# Patient Record
Sex: Male | Born: 1975
Health system: Southern US, Community
[De-identification: ages and names within clinical notes are randomized; demographics above are authoritative.]

## PROBLEM LIST (undated history)

## (undated) DIAGNOSIS — I10 Essential (primary) hypertension: Secondary | ICD-10-CM

---

## 2009-12-22 ENCOUNTER — Ambulatory Visit: Payer: Self-pay | Admitting: Infectious Diseases

## 2009-12-22 DIAGNOSIS — B54 Unspecified malaria: Secondary | ICD-10-CM | POA: Insufficient documentation

## 2009-12-22 LAB — CONVERTED CEMR LAB
ALT: 87 units/L — ABNORMAL HIGH (ref 0–53)
AST: 43 units/L — ABNORMAL HIGH (ref 0–37)
Alkaline Phosphatase: 40 units/L (ref 39–117)
BUN: 11 mg/dL (ref 6–23)
Basophils Absolute: 0.1 10*3/uL (ref 0.0–0.1)
Basophils Relative: 1 % (ref 0–1)
Chloride: 100 meq/L (ref 96–112)
Creatinine, Ser: 0.8 mg/dL (ref 0.40–1.50)
Eosinophils Absolute: 0.2 10*3/uL (ref 0.0–0.7)
Eosinophils Relative: 3 % (ref 0–5)
HCT: 41.1 % (ref 39.0–52.0)
Hemoglobin: 14 g/dL (ref 13.0–17.0)
Lymphocytes Relative: 41 % (ref 12–46)
MCHC: 34.1 g/dL (ref 30.0–36.0)
MCV: 87.1 fL (ref 78.0–100.0)
Monocytes Absolute: 0.7 10*3/uL (ref 0.1–1.0)
Platelets: 235 10*3/uL (ref 150–400)
RDW: 14.9 % (ref 11.5–15.5)

## 2009-12-23 ENCOUNTER — Encounter: Payer: Self-pay | Admitting: Infectious Diseases

## 2009-12-23 LAB — CONVERTED CEMR LAB
Basophils Absolute: 0 10*3/uL (ref 0.0–0.1)
Basophils Relative: 1 % (ref 0–1)
Eosinophils Absolute: 0.2 10*3/uL (ref 0.0–0.7)
MCHC: 33.8 g/dL (ref 30.0–36.0)
MCV: 87.3 fL (ref 78.0–100.0)
Monocytes Relative: 9 % (ref 3–12)
Neutrophils Relative %: 47 % (ref 43–77)
RBC: 4.64 M/uL (ref 4.22–5.81)
RDW: 15 % (ref 11.5–15.5)

## 2010-05-29 ENCOUNTER — Emergency Department (HOSPITAL_COMMUNITY)
Admission: EM | Admit: 2010-05-29 | Discharge: 2010-05-29 | Payer: Self-pay | Source: Home / Self Care | Admitting: Emergency Medicine

## 2010-07-31 NOTE — Assessment & Plan Note (Signed)
Summary: Dr Maleny Candy/[facility] pt[facility]   CC:  [facility] referral  r/o malaria .  History of Present Illness: 35 yo M with hx of trip to [place] for 25 days, returned 12-16-09. He states that he multiple episode of malaria while living in [place]. He had mulitple mosquito bites there. He took fansidar (2 doses while there).  Is having headaches, dizzyness, chills (mild), some GI uspet, no diarrhea, no fever ( but feels warm). He states that this is similar to when he had his previous malarial episodes. Has prev taken pyrmethrine for malaria episodes.  Has NL CBC 6-24-11at urgent care.   Preventive Screening-Counseling & Management  Alcohol-Tobacco     Alcohol drinks/day: <1     Alcohol type: beer     Smoking Status: never  Caffeine-Diet-Exercise     Caffeine use/day: 2  Safety-Violence-Falls     Seat Belt Use: yes   Current Allergies: ! * CHLOROQUINE Past History:  Past Medical History: Current Problems:  MALARIA (ICD-084.6)  Family History: denies  Social History: Never Smoked Alcohol use-yes, occasional.  Married  Review of Systems       c/o back pain, no rashes. see HPI. last HIV test 3 years ago, only sex partner is wife.   Vital Signs:  Patient profile:   35 year old male Height:      63 inches Weight:      183 pounds BMI:     32.53 BSA:     1.86 Temp:     98.4 degrees F Pulse rate:   70 / minute BP sitting:   150 / 84  Vitals Entered By: Tomasita Morrow RN (December 22, 2009 12:59 PM) CC: [facility] referral  r/o malaria  Is Patient Diabetic? No Pain Assessment Patient in pain? no      Nutritional Status BMI of > 30 = obese Nutritional Status Detail normal   Have you ever been in a relationship where you felt threatened, hurt or afraid?No  Domestic Violence Intervention none  Does patient need assistance? Functional Status Self care   Physical Exam  General:  well-developed, well-nourished, and  well-hydrated.   Eyes:  pupils equal, pupils round, and pupils reactive to light.   Mouth:  pharynx pink and moist and no exudates.   Neck:  no masses.   Lungs:  normal respiratory effort and normal breath sounds.   Heart:  normal rate, regular rhythm, and no murmur.   Abdomen:  soft, non-tender, and normal bowel sounds.   Cervical Nodes:  no anterior cervical adenopathy and no posterior cervical adenopathy.     Impression & Recommendations:  Problem # 1:  MALARIA (ICD-084.6)  His story si certainly convincing for Malaria. He probably has some degree of immunity however this does wane over time after leaving residence in an endemic area. Will send a blood smear for malaria as well as CBC, CMP. He is at risk for other illness as well (dengue, yellow fever). He does not have icterus which make these seem less likely. I offered to check a HIV test on him but he states that he is married and his wife is his only sex partner. will have him f/u if he does not feel better at end of therapy.   Orders: Consultation Level IV (16109) T-CMP with estimated GFR ([address]) T-CBC w/Diff ([id number]) T- * Misc. Laboratory test [phone])  Medications Added to Medication List This Visit: 1)  Malarone 250-100 Mg Tabs (Atovaquone-proguanil  hcl) .... 4 tab by mouth once daily for 3 days Prescriptions: MALARONE 250-100 MG TABS (ATOVAQUONE-PROGUANIL HCL) 4 tab by mouth once daily for 3 days  #12 x 0   Entered and Authorized by:   Johny Sax MD   Signed by:   Johny Sax MD on 12/22/2009   Method used:   Print then Give to Patient   RxID:   [id number]

## 2010-09-11 LAB — URINALYSIS, ROUTINE W REFLEX MICROSCOPIC
Bilirubin Urine: NEGATIVE
Glucose, UA: NEGATIVE mg/dL
Ketones, ur: NEGATIVE mg/dL
Nitrite: NEGATIVE
Protein, ur: NEGATIVE mg/dL
pH: 6 (ref 5.0–8.0)

## 2010-09-11 LAB — COMPREHENSIVE METABOLIC PANEL
CO2: 27 mEq/L (ref 19–32)
Calcium: 9 mg/dL (ref 8.4–10.5)
Creatinine, Ser: 0.69 mg/dL (ref 0.4–1.5)
GFR calc non Af Amer: >60 mL/min (ref >60)
Glucose, Bld: 111 mg/dL — ABNORMAL HIGH (ref 70–99)

## 2010-09-11 LAB — CBC
HCT: 45.2 % (ref 39.0–52.0)
Hemoglobin: 15.9 g/dL (ref 13.0–17.0)
MCH: 30 pg (ref 26.0–34.0)
MCHC: 35.2 g/dL (ref 30.0–36.0)

## 2010-09-11 LAB — LIPASE, BLOOD: Lipase: 35 U/L (ref 11–59)

## 2011-03-17 ENCOUNTER — Emergency Department (HOSPITAL_COMMUNITY)
Admission: EM | Admit: 2011-03-17 | Discharge: 2011-03-17 | Disposition: A | Discharge status: Home / Self Care | Payer: 59 | Attending: Emergency Medicine | Admitting: Emergency Medicine

## 2011-03-17 DIAGNOSIS — R55 Syncope and collapse: Secondary | ICD-10-CM | POA: Insufficient documentation

## 2011-03-17 DIAGNOSIS — I10 Essential (primary) hypertension: Secondary | ICD-10-CM | POA: Insufficient documentation

## 2011-03-17 LAB — DIFFERENTIAL
Basophils Absolute: 0 10*3/uL (ref 0.0–0.1)
Lymphocytes Relative: 43 % (ref 12–46)
Neutro Abs: 3.1 10*3/uL (ref 1.7–7.7)
Neutrophils Relative %: 47 % (ref 43–77)

## 2011-03-17 LAB — CBC
HCT: 39.7 % (ref 39.0–52.0)
Hemoglobin: 13.8 g/dL (ref 13.0–17.0)
RBC: 4.71 MIL/uL (ref 4.22–5.81)
RDW: 13.6 % (ref 11.5–15.5)
WBC: 6.5 10*3/uL (ref 4.0–10.5)

## 2011-03-17 LAB — HEPATIC FUNCTION PANEL
AST: 40 U/L — ABNORMAL HIGH (ref 0–37)
Albumin: 4.7 g/dL (ref 3.5–5.2)
Total Bilirubin: 0.5 mg/dL (ref 0.3–1.2)

## 2011-03-17 LAB — BASIC METABOLIC PANEL
BUN: 16 mg/dL (ref 6–23)
GFR calc non Af Amer: >60 mL/min (ref >60)
Glucose, Bld: 113 mg/dL — ABNORMAL HIGH (ref 70–99)
Potassium: 3.6 mEq/L (ref 3.5–5.1)

## 2011-03-23 ENCOUNTER — Inpatient Hospital Stay (INDEPENDENT_AMBULATORY_CARE_PROVIDER_SITE_OTHER)
Admission: RE | Admit: 2011-03-23 | Discharge: 2011-03-23 | Disposition: A | Discharge status: Home / Self Care | Payer: 59 | Source: Ambulatory Visit | Attending: Emergency Medicine | Admitting: Emergency Medicine

## 2011-03-23 DIAGNOSIS — I10 Essential (primary) hypertension: Secondary | ICD-10-CM

## 2011-03-23 DIAGNOSIS — M545 Low back pain, unspecified: Secondary | ICD-10-CM

## 2011-03-23 DIAGNOSIS — M542 Cervicalgia: Secondary | ICD-10-CM

## 2011-03-23 LAB — POCT I-STAT, CHEM 8
BUN: 9 mg/dL (ref 6–23)
Calcium, Ion: 1.09 mmol/L — ABNORMAL LOW (ref 1.12–1.32)
Chloride: 101 mEq/L (ref 96–112)
Creatinine, Ser: 0.9 mg/dL (ref 0.50–1.35)
Glucose, Bld: 92 mg/dL (ref 70–99)
HCT: 45 % (ref 39.0–52.0)
Hemoglobin: 15.3 g/dL (ref 13.0–17.0)
Potassium: 3.6 mEq/L (ref 3.5–5.1)
Sodium: 141 mEq/L (ref 135–145)
TCO2: 27 mmol/L (ref 0–100)

## 2012-04-21 ENCOUNTER — Encounter (HOSPITAL_COMMUNITY): Payer: Self-pay | Admitting: *Deleted

## 2012-04-21 ENCOUNTER — Emergency Department (HOSPITAL_COMMUNITY)
Admission: EM | Admit: 2012-04-21 | Discharge: 2012-04-21 | Disposition: A | Discharge status: Home / Self Care | Payer: 59 | Source: Home / Self Care | Attending: Family Medicine | Admitting: Family Medicine

## 2012-04-21 DIAGNOSIS — I10 Essential (primary) hypertension: Secondary | ICD-10-CM

## 2012-04-21 HISTORY — DX: Essential (primary) hypertension: I10

## 2012-04-21 MED ORDER — LISINOPRIL-HYDROCHLOROTHIAZIDE 20-25 MG PO TABS: 1.0000 | ORAL_TABLET | Freq: Every day | ORAL | Status: DC

## 2012-04-21 NOTE — ED Provider Notes (Signed)
History     CSN: [id number]  Arrival date & time 04/21/12  1427   First MD Initiated Contact with Patient 04/21/12 1429      Chief Complaint  Patient presents with  . Medication Refill    (Consider location/radiation/quality/duration/timing/severity/associated sxs/prior treatment) Patient is a 36 y.o. male presenting with hypertension. The history is provided by the patient.  Hypertension This is a new problem. The current episode started yesterday (ran out of med yest, not working very well.). The problem has not changed since onset.Pertinent negatives include no chest pain and no shortness of breath.    Past Medical History  Diagnosis Date  . Hypertension     History reviewed. No pertinent past surgical history.  No family history on file.  History  Substance Use Topics  . Smoking status: Never Smoker   . Smokeless tobacco: Not on file  . Alcohol Use: Yes      Review of Systems  Constitutional: Negative.   Respiratory: Negative for cough and shortness of breath.   Cardiovascular: Negative for chest pain, palpitations and leg swelling.    Allergies  Review of patient's allergies indicates no known allergies.  Home Medications   Current Outpatient Rx  Name Route Sig Dispense Refill  . LISINOPRIL 10 MG PO TABS Oral Take 10 mg by mouth daily.    . ATOVAQUONE-PROGUANIL HCL 250-100 MG PO TABS Oral Take 4 tablets by mouth daily. For 3 days     . LISINOPRIL-HYDROCHLOROTHIAZIDE 20-25 MG PO TABS Oral Take 1 tablet by mouth daily. 30 tablet 1    BP 145/95  Pulse 72  Temp 98.6 F (37 C) (Oral)  Resp 14  SpO2 100%  Physical Exam  Nursing note and vitals reviewed. Constitutional: He is oriented to person, place, and time. He appears well-developed and well-nourished.  Neck: Normal range of motion. Neck supple.  Cardiovascular: Normal rate, regular rhythm, normal heart sounds and intact distal pulses.   Pulmonary/Chest: Effort normal and breath sounds normal.    Neurological: He is alert and oriented to person, place, and time.  Skin: Skin is warm and dry.    ED Course  Procedures (including critical care time)  Labs Reviewed - No data to display No results found.   1. Hypertension, benign       MDM          Linna Hoff, MD 04/21/12 [phone]

## 2012-04-21 NOTE — ED Notes (Signed)
pT  REPORTS  HE    TOOK  HIS  LAST  LISINOPRIL PILL  YESTERDAY    HE  REPORTS  SOME   HEADACHE  AND  DIZZYNESS  HE  HAS  AN APPT  IN  1  MONTH  WITH A    PCP

## 2012-07-10 ENCOUNTER — Emergency Department (HOSPITAL_COMMUNITY): Payer: 59

## 2012-07-10 ENCOUNTER — Emergency Department (HOSPITAL_COMMUNITY)
Admission: EM | Admit: 2012-07-10 | Discharge: 2012-07-10 | Disposition: A | Discharge status: Home / Self Care | Payer: 59 | Attending: Emergency Medicine | Admitting: Emergency Medicine

## 2012-07-10 ENCOUNTER — Encounter (HOSPITAL_COMMUNITY): Payer: Self-pay | Admitting: Emergency Medicine

## 2012-07-10 DIAGNOSIS — R0789 Other chest pain: Secondary | ICD-10-CM

## 2012-07-10 DIAGNOSIS — R071 Chest pain on breathing: Secondary | ICD-10-CM | POA: Insufficient documentation

## 2012-07-10 DIAGNOSIS — I10 Essential (primary) hypertension: Secondary | ICD-10-CM | POA: Insufficient documentation

## 2012-07-10 DIAGNOSIS — Z79899 Other long term (current) drug therapy: Secondary | ICD-10-CM | POA: Insufficient documentation

## 2012-07-10 LAB — POCT I-STAT TROPONIN I: Troponin i, poc: 0 ng/mL (ref 0.00–0.08)

## 2012-07-10 LAB — CBC
HCT: 39.6 % (ref 39.0–52.0)
MCH: 29.7 pg (ref 26.0–34.0)
MCHC: 34.6 g/dL (ref 30.0–36.0)
MCV: 85.7 fL (ref 78.0–100.0)
RBC: 4.62 MIL/uL (ref 4.22–5.81)
RDW: 13.8 % (ref 11.5–15.5)

## 2012-07-10 LAB — BASIC METABOLIC PANEL WITH GFR
BUN: 12 mg/dL (ref 6–23)
CO2: 29 meq/L (ref 19–32)
Calcium: 9.4 mg/dL (ref 8.4–10.5)
Chloride: 97 meq/L (ref 96–112)
Creatinine, Ser: 0.71 mg/dL (ref 0.50–1.35)
GFR calc Af Amer: >90 mL/min
GFR calc non Af Amer: >90 mL/min
Glucose, Bld: 110 mg/dL — ABNORMAL HIGH (ref 70–99)
Potassium: 3.5 meq/L (ref 3.5–5.1)
Sodium: 136 meq/L (ref 135–145)

## 2012-07-10 LAB — TROPONIN I: Troponin I: <0.3 ng/mL (ref <0.30)

## 2012-07-10 MED ORDER — LISINOPRIL 10 MG PO TABS: 10.0000 mg | ORAL_TABLET | Freq: Every day | ORAL | Status: AC

## 2012-07-10 MED ORDER — IBUPROFEN 800 MG PO TABS: 800.0000 mg | ORAL_TABLET | Freq: Three times a day (TID) | ORAL | Status: DC

## 2012-07-10 MED ORDER — TRAMADOL HCL 50 MG PO TABS: 50.0000 mg | ORAL_TABLET | Freq: Four times a day (QID) | ORAL | Status: AC | PRN

## 2012-07-10 NOTE — ED Provider Notes (Signed)
History     CSN: [id number]  Arrival date & time 07/10/12  1045   First MD Initiated Contact with Patient 07/10/12 1113      Chief Complaint  Patient presents with  . Chest Pain    (Consider location/radiation/quality/duration/timing/severity/associated sxs/prior treatment) HPI Comments: Patient comes to the ER for evaluation of chest pain. He reports he has been having pain for approximately one week. Pain is just to the right of his sternum. He says it hurts when he stretches his arms and chest area. He is not short of breath. He denies any direct trauma. No nausea, diaphoresis.  Patient is a 37 y.o. male presenting with chest pain.  Chest Pain Pertinent negatives for primary symptoms include no shortness of breath.     Past Medical History  Diagnosis Date  . Hypertension     History reviewed. No pertinent past surgical history.  No family history on file.  History  Substance Use Topics  . Smoking status: Never Smoker   . Smokeless tobacco: Not on file  . Alcohol Use: Yes     Comment: rarely      Review of Systems  Respiratory: Negative for shortness of breath.   Cardiovascular: Positive for chest pain.  All other systems reviewed and are negative.    Allergies  Review of patient's allergies indicates no known allergies.  Home Medications   Current Outpatient Rx  Name  Route  Sig  Dispense  Refill  . ATOVAQUONE-PROGUANIL HCL 250-100 MG PO TABS   Oral   Take 4 tablets by mouth daily. For 3 days pt's has not finished therapy at this time. Pt cant confirm how long is left of therapy either.         Marland Kitchen LISINOPRIL 10 MG PO TABS   Oral   Take 10 mg by mouth daily.           BP 141/86  Pulse 61  Temp 98.7 F (37.1 C) (Oral)  Resp 20  SpO2 100%  Physical Exam  Constitutional: He is oriented to person, place, and time. He appears well-developed and well-nourished. No distress.  HENT:  Head: Normocephalic and atraumatic.  Right Ear: Hearing  normal.  Nose: Nose normal.  Mouth/Throat: Oropharynx is clear and moist and mucous membranes are normal.  Eyes: Conjunctivae normal and EOM are normal. Pupils are equal, round, and reactive to light.  Neck: Normal range of motion. Neck supple.  Cardiovascular: Normal rate, regular rhythm, S1 normal and S2 normal.  Exam reveals no gallop and no friction rub.   No murmur heard. Pulmonary/Chest: Effort normal and breath sounds normal. No respiratory distress.    Abdominal: Soft. Normal appearance and bowel sounds are normal. There is no hepatosplenomegaly. There is no tenderness. There is no rebound, no guarding, no tenderness at McBurney's point and negative Murphy's sign. No hernia.  Musculoskeletal: Normal range of motion.  Neurological: He is alert and oriented to person, place, and time. He has normal strength. No cranial nerve deficit or sensory deficit. Coordination normal. GCS eye subscore is 4. GCS verbal subscore is 5. GCS motor subscore is 6.  Skin: Skin is warm, dry and intact. No rash noted. No cyanosis.  Psychiatric: He has a normal mood and affect. His speech is normal and behavior is normal. Thought content normal.    ED Course  Procedures (including critical care time)   Labs Reviewed  POCT I-STAT TROPONIN I  CBC  BASIC METABOLIC PANEL  TROPONIN I   No  results found.    diagnosis: Chest wall pain    MDM  Patient presents to the ER for evaluation of chest pain. He has been having pain for a week. Pain is just to the right of the sternal border. He is tender in this area. Symptoms are consistent with chest wall pain. Patient's EKG is entirely normal. He has a normal troponin. This is reassuring after a week of pain. His examination also supports diagnosis of musculoskeletal etiology. Patient will be discharged with treatment with anti-inflammatory medication. Followup as needed.        Gilda Crease, MD 07/13/12 1650

## 2012-07-10 NOTE — ED Notes (Signed)
Pt presenting to ed with c/o chest pain pt states it's more sternal pain x 1 week. Pt denies nausea, vomiting and shortness of breath at this time

## 2012-07-10 NOTE — ED Notes (Signed)
Pt took all belongings upon discharge.

## 2013-10-21 ENCOUNTER — Other Ambulatory Visit (HOSPITAL_COMMUNITY): Payer: Self-pay | Admitting: Otolaryngology

## 2013-10-21 DIAGNOSIS — Z01818 Encounter for other preprocedural examination: Secondary | ICD-10-CM

## 2014-01-05 ENCOUNTER — Ambulatory Visit (HOSPITAL_COMMUNITY)
Admission: RE | Admit: 2014-01-05 | Discharge: 2014-01-05 | Disposition: A | Discharge status: Home / Self Care | Payer: 59 | Source: Ambulatory Visit | Attending: Otolaryngology | Admitting: Otolaryngology

## 2014-01-05 ENCOUNTER — Encounter (HOSPITAL_COMMUNITY): Payer: Self-pay

## 2014-01-05 DIAGNOSIS — Z01818 Encounter for other preprocedural examination: Secondary | ICD-10-CM | POA: Insufficient documentation

## 2014-01-05 MED ORDER — IOHEXOL 300 MG/ML  SOLN
80.0000 mL | Freq: Once | INTRAMUSCULAR | Status: AC | PRN
Start: 2014-01-05 — End: 2014-01-05
  Administered 2014-01-05: 80 mL via INTRAVENOUS

## 2014-01-05 MED ORDER — IOHEXOL 300 MG/ML  SOLN: 100.0000 mL | Freq: Once | INTRAMUSCULAR | Status: DC | PRN

## 2014-11-12 ENCOUNTER — Encounter (HOSPITAL_COMMUNITY): Payer: Self-pay | Admitting: Family Medicine

## 2014-11-12 ENCOUNTER — Emergency Department (HOSPITAL_COMMUNITY)
Admission: EM | Admit: 2014-11-12 | Discharge: 2014-11-12 | Disposition: A | Discharge status: Home / Self Care | Payer: 59 | Attending: Emergency Medicine | Admitting: Emergency Medicine

## 2014-11-12 DIAGNOSIS — B349 Viral infection, unspecified: Secondary | ICD-10-CM | POA: Insufficient documentation

## 2014-11-12 DIAGNOSIS — I10 Essential (primary) hypertension: Secondary | ICD-10-CM | POA: Insufficient documentation

## 2014-11-12 DIAGNOSIS — J029 Acute pharyngitis, unspecified: Secondary | ICD-10-CM

## 2014-11-12 DIAGNOSIS — Z79899 Other long term (current) drug therapy: Secondary | ICD-10-CM | POA: Insufficient documentation

## 2014-11-12 DIAGNOSIS — R509 Fever, unspecified: Secondary | ICD-10-CM

## 2014-11-12 LAB — BASIC METABOLIC PANEL
ANION GAP: 10 (ref 5–15)
BUN: 10 mg/dL (ref 6–20)
CALCIUM: 9.1 mg/dL (ref 8.9–10.3)
CHLORIDE: 99 mmol/L — AB (ref 101–111)
CO2: 25 mmol/L (ref 22–32)
Creatinine, Ser: 0.8 mg/dL (ref 0.61–1.24)
GFR calc Af Amer: >60 mL/min (ref >60)
Glucose, Bld: 107 mg/dL — ABNORMAL HIGH (ref 65–99)
POTASSIUM: 3.7 mmol/L (ref 3.5–5.1)
SODIUM: 134 mmol/L — AB (ref 135–145)

## 2014-11-12 LAB — CBC WITH DIFFERENTIAL/PLATELET
Basophils Absolute: 0 10*3/uL (ref 0.0–0.1)
Basophils Relative: 0 % (ref 0–1)
Eosinophils Absolute: 0 10*3/uL (ref 0.0–0.7)
Eosinophils Relative: 0 % (ref 0–5)
HCT: 41 % (ref 39.0–52.0)
Hemoglobin: 14.2 g/dL (ref 13.0–17.0)
Lymphocytes Relative: 15 % (ref 12–46)
Lymphs Abs: 1.7 10*3/uL (ref 0.7–4.0)
MCH: 30.1 pg (ref 26.0–34.0)
MCHC: 34.6 g/dL (ref 30.0–36.0)
MCV: 87 fL (ref 78.0–100.0)
Monocytes Absolute: 0.6 10*3/uL (ref 0.1–1.0)
Monocytes Relative: 5 % (ref 3–12)
Neutro Abs: 8.6 10*3/uL — ABNORMAL HIGH (ref 1.7–7.7)
Neutrophils Relative %: 80 % — ABNORMAL HIGH (ref 43–77)
Platelets: 185 10*3/uL (ref 150–400)
RBC: 4.71 MIL/uL (ref 4.22–5.81)
RDW: 13.2 % (ref 11.5–15.5)
WBC: 10.8 10*3/uL — ABNORMAL HIGH (ref 4.0–10.5)

## 2014-11-12 MED ORDER — ACETAMINOPHEN 500 MG PO TABS
1000.0000 mg | ORAL_TABLET | Freq: Once | ORAL | Status: AC
  Administered 2014-11-12: 1000 mg via ORAL
  Filled 2014-11-12: qty 2

## 2014-11-12 MED ORDER — SODIUM CHLORIDE 0.9 % IV BOLUS (SEPSIS)
1000.0000 mL | Freq: Once | INTRAVENOUS | Status: AC
  Administered 2014-11-12: 1000 mL via INTRAVENOUS

## 2014-11-12 NOTE — ED Notes (Signed)
Pt comfortable with discharge and follow up instructions. Pt declines wheelchair, escorted to waiting area by this RN. No prescriptions. 

## 2014-11-12 NOTE — Discharge Instructions (Signed)

## 2014-11-12 NOTE — ED Provider Notes (Signed)
CSN: [id number]     Arrival date & time 11/12/14  0744 History   First MD Initiated Contact with Patient 11/12/14 [phone]     Chief Complaint  Patient presents with  . Fever  . Sore Throat     (Consider location/radiation/quality/duration/timing/severity/associated sxs/prior Treatment) Patient is a 39 y.o. male presenting with URI.  URI Presenting symptoms: fever and sore throat   Fever:    Duration:  6 hours   Timing:  Intermittent   Max temp PTA (F):  102.6   Progression:  Waxing and waning Severity:  Moderate Onset quality:  Gradual Duration:  12 hours Timing:  Constant Progression:  Worsening Chronicity:  New Worsened by:  Eating and drinking Associated symptoms: myalgias   Associated symptoms: no sinus pain   Risk factors: sick contacts     Past Medical History  Diagnosis Date  . Hypertension    History reviewed. No pertinent past surgical history. History reviewed. No pertinent family history. History  Substance Use Topics  . Smoking status: Never Smoker   . Smokeless tobacco: Not on file  . Alcohol Use: Yes     Comment: rarely    Review of Systems  Constitutional: Positive for fever.  HENT: Positive for sore throat.   Musculoskeletal: Positive for myalgias.  All other systems reviewed and are negative.     Allergies  Review of patient's allergies indicates no known allergies.  Home Medications   Prior to Admission medications   Medication Sig Start Date End Date Taking? Authorizing Provider  atovaquone-proguanil (MALARONE) 250-100 MG TABS Take 4 tablets by mouth daily. For 3 days pt's has not finished therapy at this time. Pt cant confirm how long is left of therapy either.    Historical Provider, MD  ibuprofen (ADVIL,MOTRIN) 800 MG tablet Take 1 tablet (800 mg total) by mouth 3 (three) times daily. 07/10/12   Gilda Creasehristopher J Pollina, MD  lisinopril (PRINIVIL) 10 MG tablet Take 1 tablet (10 mg total) by mouth daily. 07/10/12   Gilda Creasehristopher J Pollina, MD   lisinopril (PRINIVIL,ZESTRIL) 10 MG tablet Take 10 mg by mouth daily.    Historical Provider, MD  traMADol (ULTRAM) 50 MG tablet Take 1 tablet (50 mg total) by mouth every 6 (six) hours as needed for pain. 07/10/12   Gilda Creasehristopher J Pollina, MD   BP 152/89 mmHg  Pulse 123  Temp(Src) 98.7 F (37.1 C) (Oral)  Resp 22  SpO2 100% Physical Exam  Constitutional: He is oriented to person, place, and time. He appears well-developed and well-nourished. No distress.  HENT:  Head: Normocephalic and atraumatic.  Mouth/Throat: Oropharynx is clear and moist and mucous membranes are normal. No oropharyngeal exudate, posterior oropharyngeal edema, posterior oropharyngeal erythema or tonsillar abscesses.  Eyes: Conjunctivae are normal. Pupils are equal, round, and reactive to light. No scleral icterus.  Neck: Neck supple.  Cardiovascular: Normal rate, regular rhythm, normal heart sounds and intact distal pulses.   No murmur heard. Pulmonary/Chest: Effort normal and breath sounds normal. No stridor. No respiratory distress. He has no wheezes. He has no rales.  Abdominal: Soft. He exhibits no distension. There is no tenderness.  Musculoskeletal: Normal range of motion. He exhibits no edema.  Neurological: He is alert and oriented to person, place, and time.  Skin: Skin is warm and dry. No rash noted.  Psychiatric: He has a normal mood and affect. His behavior is normal.  Nursing note and vitals reviewed.   ED Course  Procedures (including critical care time) Labs Review Labs  Reviewed  CBC WITH DIFFERENTIAL/PLATELET - Abnormal; Notable for the following:    WBC 10.8 (*)    Neutrophils Relative % 80 (*)    Neutro Abs 8.6 (*)    All other components within normal limits  BASIC METABOLIC PANEL - Abnormal; Notable for the following:    Sodium 134 (*)    Chloride 99 (*)    Glucose, Bld 107 (*)    All other components within normal limits    Imaging Review No results found.   EKG  Interpretation None      MDM   Final diagnoses:  Fever, unspecified fever cause  Sore throat  Viral syndrome    39 yo male with sore throat and fever.  Found to be tachycardic on exam, but otherwise well appearing.  Likely viral syndrome.  However, he reports abnormal labs at last office visit (specifically elevated CK).  Will check screening labs and give fluid bolus, tylenol.    Tachycardia improved with tylenol and fluids.  Remained well appearing.  Felt better after tylenol.  Likely has viral syndrome.  No evidence of toxicity or bacterial infection.  Plan dc with supportive care and follow up.  Return precautions given.   Blake DivineJohn Areli Jowett, MD 11/12/14 1027

## 2014-11-12 NOTE — ED Notes (Signed)
Pt here for sore throat and fever since last night

## 2015-07-17 MED FILL — AMLODIPINE BESYLATE 5 MG TA: 5 | 90 days supply | Qty: 90 | Fill #0

## 2015-07-17 MED FILL — LISINOPRIL 40 MG TABLET: 40 | 90 days supply | Qty: 90 | Fill #0

## 2015-08-30 DIAGNOSIS — R7309 Other abnormal glucose: Secondary | ICD-10-CM | POA: Diagnosis not present

## 2015-08-30 DIAGNOSIS — I10 Essential (primary) hypertension: Secondary | ICD-10-CM | POA: Diagnosis not present

## 2015-10-16 MED FILL — AMLODIPINE BESYLATE 5 MG TA: 5 | 90 days supply | Qty: 90 | Fill #1

## 2015-10-16 MED FILL — LISINOPRIL 40 MG TABLET: 40 | 90 days supply | Qty: 90 | Fill #1

## 2016-01-15 MED FILL — LISINOPRIL 40 MG TABLET: 40 | 90 days supply | Qty: 90 | Fill #2

## 2016-01-15 MED FILL — AMLODIPINE BESYLATE 5 MG TA: 5 | 90 days supply | Qty: 90 | Fill #2

## 2016-04-17 MED FILL — LISINOPRIL 40 MG TABLET: 40 | 90 days supply | Qty: 90 | Fill #3

## 2016-04-17 MED FILL — AMLODIPINE BESYLATE 5 MG TA: 5 | 90 days supply | Qty: 90 | Fill #3

## 2016-06-19 DIAGNOSIS — Z1389 Encounter for screening for other disorder: Secondary | ICD-10-CM | POA: Diagnosis not present

## 2016-06-19 DIAGNOSIS — I10 Essential (primary) hypertension: Secondary | ICD-10-CM | POA: Diagnosis not present

## 2016-06-19 DIAGNOSIS — Z0000 Encounter for general adult medical examination without abnormal findings: Secondary | ICD-10-CM | POA: Diagnosis not present

## 2016-06-19 DIAGNOSIS — M47812 Spondylosis without myelopathy or radiculopathy, cervical region: Secondary | ICD-10-CM | POA: Diagnosis not present

## 2016-06-19 MED FILL — IBUPROFEN 600 MG TABLET: 600 | 30 days supply | Qty: 60 | Fill #0

## 2016-06-24 ENCOUNTER — Emergency Department (HOSPITAL_COMMUNITY)
Admission: EM | Admit: 2016-06-24 | Discharge: 2016-06-24 | Disposition: A | Discharge status: Home / Self Care | Payer: 59 | Attending: Emergency Medicine | Admitting: Emergency Medicine

## 2016-06-24 ENCOUNTER — Encounter (HOSPITAL_COMMUNITY): Payer: Self-pay | Admitting: Emergency Medicine

## 2016-06-24 ENCOUNTER — Emergency Department (HOSPITAL_COMMUNITY): Payer: 59

## 2016-06-24 DIAGNOSIS — X501XXA Overexertion from prolonged static or awkward postures, initial encounter: Secondary | ICD-10-CM | POA: Insufficient documentation

## 2016-06-24 DIAGNOSIS — Y9344 Activity, trampolining: Secondary | ICD-10-CM | POA: Diagnosis not present

## 2016-06-24 DIAGNOSIS — Y999 Unspecified external cause status: Secondary | ICD-10-CM | POA: Diagnosis not present

## 2016-06-24 DIAGNOSIS — M542 Cervicalgia: Secondary | ICD-10-CM | POA: Diagnosis not present

## 2016-06-24 DIAGNOSIS — S161XXA Strain of muscle, fascia and tendon at neck level, initial encounter: Secondary | ICD-10-CM | POA: Insufficient documentation

## 2016-06-24 DIAGNOSIS — I10 Essential (primary) hypertension: Secondary | ICD-10-CM | POA: Diagnosis not present

## 2016-06-24 DIAGNOSIS — Z79899 Other long term (current) drug therapy: Secondary | ICD-10-CM | POA: Diagnosis not present

## 2016-06-24 DIAGNOSIS — Y929 Unspecified place or not applicable: Secondary | ICD-10-CM | POA: Diagnosis not present

## 2016-06-24 DIAGNOSIS — S199XXA Unspecified injury of neck, initial encounter: Secondary | ICD-10-CM | POA: Diagnosis not present

## 2016-06-24 MED ORDER — IBUPROFEN 600 MG PO TABS: 600.0000 mg | ORAL_TABLET | Freq: Four times a day (QID) | ORAL | 0 refills | Status: DC | PRN

## 2016-06-24 MED ORDER — DIAZEPAM 5 MG PO TABS
5.0000 mg | ORAL_TABLET | Freq: Once | ORAL | Status: AC
  Administered 2016-06-24: 5 mg via ORAL
  Filled 2016-06-24: qty 1

## 2016-06-24 MED ORDER — DIAZEPAM 5 MG PO TABS: 5.0000 mg | ORAL_TABLET | Freq: Two times a day (BID) | ORAL | 0 refills | Status: AC

## 2016-06-24 MED ORDER — HYDROCODONE-ACETAMINOPHEN 5-325 MG PO TABS
1.0000 | ORAL_TABLET | Freq: Once | ORAL | Status: AC
  Administered 2016-06-24: 1 via ORAL
  Filled 2016-06-24: qty 1

## 2016-06-24 MED ORDER — OXYCODONE-ACETAMINOPHEN 5-325 MG PO TABS: 1.0000 | ORAL_TABLET | ORAL | 0 refills | Status: AC | PRN

## 2016-06-24 NOTE — ED Triage Notes (Signed)
Pt states he was on the trampoline yesterday and tried to do a flip and landed on his head. Pt c/o neck pain, states hes unable to turn his neck from side to side. No midline cervical tenderness.

## 2016-06-24 NOTE — Discharge Instructions (Signed)
Take your medications as prescribed for pain relief. You may also apply ice and/or heat to affected area for 15-20 minutes 3-4 times daily for additional pain relief. I recommend refraining from doing any repetitive movements of your neck that worsens your pain or any heavy lifting with your upper or lower extremities. Your neck CT today showed central disc herniation at C4-5 and posterior osteophyte and disc bulge complex at C5-6 which results in an element of spinal stenosis. I recommend following up with the neurosurgeon listed above for further management is improved with next week. Please return to the Emergency Department if symptoms worsen or new onset of fever, headache, lightheadedness, dizziness, visual changes, neck stiffness, decreased range of motion, arm weakness, numbness, tingling, swelling.

## 2016-06-24 NOTE — ED Provider Notes (Signed)
[facility]-EMERGENCY DEPT Provider Note   CSN: [id number] Arrival date & time: 06/24/16  1102     History   Chief Complaint Chief Complaint  Patient presents with  . Neck Pain    HPI Bradley Robles is a 40 y.o. male.  HPI   Patient is a 40 year old male with history of hypertension who presents to the ED with complaint of neck pain, onset yesterday afternoon. Patient reports while he was jumping on the chair playing with his kids he tried to do a front flip which resulted in him landing on the back of his head and neck. Patient reports he has had constant gradually worsening pain to the sides of his neck since the injury. He reports the pain is alleviated when he keeps his neck still but is worsened when turning side to side. Endorses associated neck stiffness and decreased range of motion. Patient reports taking 400 mg ibuprofen this morning with mild intermittent relief. Denies fever, headache, lightheadedness, dizziness, visual changes, confusion, chest pain, shortness of breath, back pain, abdominal pain, numbness, tingling, weakness. Patient denies any prior injuries to his neck but reports history of cervical spondylolisthesis.  Past Medical History:  Diagnosis Date  . Hypertension     Patient Active Problem List   Diagnosis Date Noted  . MALARIA 12/22/2009    History reviewed. No pertinent surgical history.     Home Medications    Prior to Admission medications   Medication Sig Start Date End Date Taking? Authorizing Provider  amLODipine (NORVASC) 5 MG tablet Take 5 mg by mouth daily.   Yes Historical Provider, MD  lisinopril (PRINIVIL) 10 MG tablet Take 1 tablet (10 mg total) by mouth daily. 07/10/12  Yes Gilda Crease, MD  Multiple Vitamins-Minerals (MEGA MULTIVITAMIN FOR MEN PO) Take 1 tablet by mouth daily.   Yes Historical Provider, MD  diazepam (VALIUM) 5 MG tablet Take 1 tablet (5 mg total) by mouth 2 (two) times daily. 06/24/16   Barrett Henle,  PA-C  ibuprofen (ADVIL,MOTRIN) 600 MG tablet Take 1 tablet (600 mg total) by mouth every 6 (six) hours as needed. 06/24/16   Barrett Henle, PA-C  oxyCODONE-acetaminophen (PERCOCET/ROXICET) 5-325 MG tablet Take 1 tablet by mouth every 4 (four) hours as needed for severe pain. 06/24/16   Barrett Henle, PA-C  traMADol (ULTRAM) 50 MG tablet Take 1 tablet (50 mg total) by mouth every 6 (six) hours as needed for pain. Patient not taking: Reported on 06/24/2016 07/10/12   Gilda Crease, MD    Family History No family history on file.  Social History Social History  Substance Use Topics  . Smoking status: Never Smoker  . Smokeless tobacco: Not on file  . Alcohol use Yes     Comment: rarely     Allergies   Patient has no known allergies.   Review of Systems Review of Systems  Musculoskeletal: Positive for neck pain.  All other systems reviewed and are negative.    Physical Exam Updated Vital Signs BP 154/98 (BP Location: Right Arm)   Pulse 90   Temp 99.9 F (37.7 C) (Oral)   Resp 16   SpO2 100%   Physical Exam  Constitutional: He is oriented to person, place, and time. He appears well-developed and well-nourished.  HENT:  Head: Normocephalic and atraumatic. Head is without raccoon's eyes, without Battle's sign, without abrasion and without laceration.  Right Ear: Tympanic membrane normal. No hemotympanum.  Left Ear: Tympanic membrane normal. No hemotympanum.  Nose: Nose normal. No sinus tenderness, nasal deformity, septal deviation or nasal septal hematoma. No epistaxis.  Mouth/Throat: Uvula is midline, oropharynx is clear and moist and mucous membranes are normal. No oropharyngeal exudate, posterior oropharyngeal edema, posterior oropharyngeal erythema or tonsillar abscesses.  Eyes: Conjunctivae and EOM are normal. Pupils are equal, round, and reactive to light. Right eye exhibits no discharge. Left eye exhibits no discharge. No scleral icterus.    Neck: Neck supple. Muscular tenderness present. No spinous process tenderness present. No neck rigidity. Decreased range of motion present. No edema and no erythema present.    Cardiovascular: Normal rate, regular rhythm, normal heart sounds and intact distal pulses.   Pulmonary/Chest: Effort normal and breath sounds normal. No respiratory distress. He has no wheezes. He has no rales. He exhibits no tenderness.  Abdominal: Soft. Bowel sounds are normal. He exhibits no distension and no mass. There is no tenderness. There is no rebound and no guarding. No hernia.  Musculoskeletal: He exhibits tenderness. He exhibits no edema or deformity.  No cervical, thoracic, or lumbar spine midline TTP. TTP over bilateral cervical paraspinal muscles and upper trapezius. Dec. ROM of neck due to reported pain bilaterally. Full ROM of bilateral upper and lower extremities with 5/5 strength.   2+ radial and PT pulses. Sensation grossly intact.   Neurological: He is alert and oriented to person, place, and time. He has normal strength. No cranial nerve deficit or sensory deficit. Coordination and gait normal.  Skin: Skin is warm and dry. Capillary refill takes less than 2 seconds. No rash noted.  Nursing note and vitals reviewed.    ED Treatments / Results  Labs (all labs ordered are listed, but only abnormal results are displayed) Labs Reviewed - No data to display  EKG  EKG Interpretation None       Radiology Ct Cervical Spine Wo Contrast  Result Date: 06/24/2016 CLINICAL DATA:  Injury yesterday.  Neck pain. EXAM: CT CERVICAL SPINE WITHOUT CONTRAST TECHNIQUE: Multidetector CT imaging of the cervical spine was performed without intravenous contrast. Multiplanar CT image reconstructions were also generated. COMPARISON:  None. FINDINGS: Alignment: Cervical lordosis is reversed. Skull base and vertebrae: No acute fracture. No dislocation. No vertebral compression deformity. Soft tissues and spinal  canal: No obvious spinal hematoma. No obvious evidence of soft tissue injury. Thyroid is unremarkable. No obvious abnormal adenopathy. Disc levels: There is a suspected central disc herniation at C4-5 which impacts the cord. There is a posterior osteophyte and disc bulge complex at C5-6 which results in an element of spinal stenosis. Upper chest: Right apical scarring. Other: Noncontributory IMPRESSION: No acute bony injury. C4-5 disc herniation. Degenerative disc disease at C5-6 with spinal stenosis PE Electronically Signed   By: Jolaine ClickArthur  Hoss M.D.   On: 06/24/2016 12:02    Procedures Procedures (including critical care time)  Medications Ordered in ED Medications  HYDROcodone-acetaminophen (NORCO/VICODIN) 5-325 MG per tablet 1 tablet (1 tablet Oral Given 06/24/16 1127)  diazepam (VALIUM) tablet 5 mg (5 mg Oral Given 06/24/16 1147)     Initial Impression / Assessment and Plan / ED Course  I have reviewed the triage vital signs and the nursing notes.  Pertinent labs & imaging results that were available during my care of the patient were reviewed by me and considered in my medical decision making (see chart for details).  Clinical Course     Patient presents with neck pain for the past 2 days after falling and landing on the back of his head  and neck while trying to do a front flip on a trampoline. Denies LOC. Denies any neuro deficits. Pain worse with movement, reports decreased range of motion due to associated pain. VSS. Exam revealed tenderness over bilateral cervical paraspinal muscles and upper trapezius. No midline spinal tenderness. Decreased range of motion of neck due to reported pain, denies radiation. Full range of motion with 5 out of 5 strength noted to bilateral upper and lower extremities. Extremities neurovascularly intact. Remaining exam unremarkable. No neuro deficits. No evidence of head injury. Patient given pain meds and muscle relaxant in the ED. CT cervical spine revealed  C4-5 disc herniation, degenerative disc disease at C5-6 with spinal stenosis. On reevaluation patient is resting in bed and reports improvement of pain. Patient with increase in range of motion of neck. Discussed results with patient. Advised patient to follow up with neurosurgery outpatient if his symptoms have not improved or worsened over the next week. Plan to discharge patient home with pain meds, muscle relaxant and symptomatically treatment. Discussed return to work return precautions.  Final Clinical Impressions(s) / ED Diagnoses   Final diagnoses:  Neck pain  Acute strain of neck muscle, initial encounter    New Prescriptions New Prescriptions   DIAZEPAM (VALIUM) 5 MG TABLET    Take 1 tablet (5 mg total) by mouth 2 (two) times daily.   IBUPROFEN (ADVIL,MOTRIN) 600 MG TABLET    Take 1 tablet (600 mg total) by mouth every 6 (six) hours as needed.   OXYCODONE-ACETAMINOPHEN (PERCOCET/ROXICET) 5-325 MG TABLET    Take 1 tablet by mouth every 4 (four) hours as needed for severe pain.     Satira Sarkicole Elizabeth West MountainNadeau, [place]PA-C 06/24/16 1245    Linwood DibblesJon Knapp, MD 06/27/16 1013

## 2016-06-25 MED FILL — diazePAM 5 MG TABS: 5 | 5 days supply | Qty: 10 | Fill #0

## 2016-06-25 MED FILL — OXYCODONE W/APAP 5/325 TAB: 5-325 | 2 days supply | Qty: 15 | Fill #0

## 2016-07-05 ENCOUNTER — Encounter (HOSPITAL_COMMUNITY): Payer: Self-pay | Admitting: Nurse Practitioner

## 2016-07-05 ENCOUNTER — Emergency Department (HOSPITAL_COMMUNITY)
Admission: EM | Admit: 2016-07-05 | Discharge: 2016-07-05 | Disposition: A | Discharge status: Home / Self Care | Payer: 59 | Attending: Emergency Medicine | Admitting: Emergency Medicine

## 2016-07-05 ENCOUNTER — Emergency Department (HOSPITAL_COMMUNITY): Payer: 59

## 2016-07-05 DIAGNOSIS — R079 Chest pain, unspecified: Secondary | ICD-10-CM | POA: Diagnosis not present

## 2016-07-05 DIAGNOSIS — Z79899 Other long term (current) drug therapy: Secondary | ICD-10-CM | POA: Diagnosis not present

## 2016-07-05 DIAGNOSIS — I309 Acute pericarditis, unspecified: Secondary | ICD-10-CM | POA: Diagnosis not present

## 2016-07-05 DIAGNOSIS — I10 Essential (primary) hypertension: Secondary | ICD-10-CM | POA: Diagnosis not present

## 2016-07-05 LAB — BASIC METABOLIC PANEL
ANION GAP: 10 (ref 5–15)
BUN: 6 mg/dL (ref 6–20)
CALCIUM: 9.7 mg/dL (ref 8.9–10.3)
CHLORIDE: 100 mmol/L — AB (ref 101–111)
CO2: 28 mmol/L (ref 22–32)
Creatinine, Ser: 0.68 mg/dL (ref 0.61–1.24)
GFR calc Af Amer: >60 mL/min (ref >60)
GFR calc non Af Amer: >60 mL/min (ref >60)
Glucose, Bld: 102 mg/dL — ABNORMAL HIGH (ref 65–99)
POTASSIUM: 4 mmol/L (ref 3.5–5.1)
Sodium: 138 mmol/L (ref 135–145)

## 2016-07-05 LAB — CBC WITH DIFFERENTIAL/PLATELET
BASOS ABS: 0 10*3/uL (ref 0.0–0.1)
Basophils Relative: 0 %
Eosinophils Absolute: 0.1 10*3/uL (ref 0.0–0.7)
Eosinophils Relative: 3 %
HEMATOCRIT: 39 % (ref 39.0–52.0)
HEMOGLOBIN: 13.5 g/dL (ref 13.0–17.0)
LYMPHS PCT: 52 %
Lymphs Abs: 2.7 10*3/uL (ref 0.7–4.0)
MCH: 30.3 pg (ref 26.0–34.0)
MCHC: 34.6 g/dL (ref 30.0–36.0)
MCV: 87.4 fL (ref 78.0–100.0)
MONO ABS: 0.4 10*3/uL (ref 0.1–1.0)
Monocytes Relative: 7 %
NEUTROS ABS: 2 10*3/uL (ref 1.7–7.7)
NEUTROS PCT: 38 %
Platelets: 192 10*3/uL (ref 150–400)
RBC: 4.46 MIL/uL (ref 4.22–5.81)
RDW: 13.5 % (ref 11.5–15.5)
WBC: 5.3 10*3/uL (ref 4.0–10.5)

## 2016-07-05 LAB — I-STAT TROPONIN, ED: Troponin i, poc: 0 ng/mL (ref 0.00–0.08)

## 2016-07-05 MED ORDER — IBUPROFEN 800 MG PO TABS: 800.0000 mg | ORAL_TABLET | Freq: Three times a day (TID) | ORAL | 0 refills | Status: AC

## 2016-07-05 MED ORDER — ASPIRIN 81 MG PO CHEW
324.0000 mg | CHEWABLE_TABLET | Freq: Once | ORAL | Status: AC
  Administered 2016-07-05: 324 mg via ORAL
  Filled 2016-07-05: qty 4

## 2016-07-05 NOTE — ED Triage Notes (Addendum)
Pt presents with c/o CP and rectal bleeding. The chest pain began about 2 weeks ago when he sustained a neck injury. The chest pain has been intermittent since. He was seen in the ER for the neck injury but states he was not worked up for the chest pain at that time. He was given ibuprofen 600mg  to take for pain at home which does provide temporary relief of the neck and  chest pain. The chest pain increases with cough. The rectal bleeding began 3 days ago. The bleeding has occurred twice after bowel movements. He describes as drops of blood in the toilet. He reports constipation. He denies fevers, dizziness, syncope, cough, SOB, nausea, vomiting, abdominal pain.

## 2016-07-05 NOTE — ED Notes (Signed)
EKG given to Dr. Yelverton 

## 2016-07-05 NOTE — ED Provider Notes (Signed)
[facility] Provider Note   CSN: [id number] Arrival date & time: 07/05/16  1206     History   Chief Complaint Chief Complaint  Patient presents with  . Chest Pain    HPI Bradley Robles is a 41 y.o. male.  Patient presents to the emergency department with chief complaint of chest pain. He states that the pain started on Christmas Eve. He states the pain has been persistent until now. He has been taking ibuprofen with good relief. States that the symptoms return after the ibuprofen wears off. He reports that the pain is worsened when lying down, and improved when sitting up. He denies any history of heart disease. Cardiac risk factors include hypertension, hyperlipidemia, and family history. He denies any associated shortness breath, diaphoresis, or nausea. There are no other associated symptoms.   The history is provided by the patient. No language interpreter was used.    Past Medical History:  Diagnosis Date  . Hypertension     Patient Active Problem List   Diagnosis Date Noted  . MALARIA 12/22/2009    History reviewed. No pertinent surgical history.     Home Medications    Prior to Admission medications   Medication Sig Start Date End Date Taking? Authorizing Provider  amLODipine (NORVASC) 5 MG tablet Take 5 mg by mouth daily.    Historical Provider, MD  diazepam (VALIUM) 5 MG tablet Take 1 tablet (5 mg total) by mouth 2 (two) times daily. 06/24/16   Barrett HenleNicole Elizabeth Nadeau, PA-C  ibuprofen (ADVIL,MOTRIN) 600 MG tablet Take 1 tablet (600 mg total) by mouth every 6 (six) hours as needed. 06/24/16   Barrett HenleNicole Elizabeth Nadeau, PA-C  lisinopril (PRINIVIL) 10 MG tablet Take 1 tablet (10 mg total) by mouth daily. 07/10/12   Gilda Creasehristopher J Pollina, MD  Multiple Vitamins-Minerals (MEGA MULTIVITAMIN FOR MEN PO) Take 1 tablet by mouth daily.    Historical Provider, MD  oxyCODONE-acetaminophen (PERCOCET/ROXICET) 5-325 MG tablet Take 1 tablet by mouth every 4 (four) hours as  needed for severe pain. 06/24/16   Barrett HenleNicole Elizabeth Nadeau, PA-C  traMADol (ULTRAM) 50 MG tablet Take 1 tablet (50 mg total) by mouth every 6 (six) hours as needed for pain. Patient not taking: Reported on 06/24/2016 07/10/12   Gilda Creasehristopher J Pollina, MD    Family History History reviewed. No pertinent family history.  Social History Social History  Substance Use Topics  . Smoking status: Never Smoker  . Smokeless tobacco: Never Used  . Alcohol use Yes     Comment: rarely     Allergies   Patient has no known allergies.   Review of Systems Review of Systems  All other systems reviewed and are negative.    Physical Exam Updated Vital Signs BP 139/98   Pulse 75   Temp 97.9 F (36.6 C) (Oral)   Resp 16   Ht 5\' 5"  (1.651 m)   Wt 82.1 kg   SpO2 98%   BMI 30.12 kg/m   Physical Exam  Constitutional: He is oriented to person, place, and time. He appears well-developed and well-nourished.  HENT:  Head: Normocephalic and atraumatic.  Eyes: Conjunctivae and EOM are normal. Pupils are equal, round, and reactive to light. Right eye exhibits no discharge. Left eye exhibits no discharge. No scleral icterus.  Neck: Normal range of motion. Neck supple. No JVD present.  Cardiovascular: Normal rate, regular rhythm and normal heart sounds.  Exam reveals no gallop and no friction rub.   No murmur heard. Pulmonary/Chest:  Effort normal and breath sounds normal. No respiratory distress. He has no wheezes. He has no rales. He exhibits no tenderness.  Abdominal: Soft. He exhibits no distension and no mass. There is no tenderness. There is no rebound and no guarding.  Musculoskeletal: Normal range of motion. He exhibits no edema or tenderness.  Neurological: He is alert and oriented to person, place, and time.  Skin: Skin is warm and dry.  Psychiatric: He has a normal mood and affect. His behavior is normal. Judgment and thought content normal.  Nursing note and vitals reviewed.    ED  Treatments / Results  Labs (all labs ordered are listed, but only abnormal results are displayed) Labs Reviewed  BASIC METABOLIC PANEL - Abnormal; Notable for the following:       Result Value   Chloride 100 (*)    Glucose, Bld 102 (*)    All other components within normal limits  CBC WITH DIFFERENTIAL/PLATELET  I-STAT TROPOININ, ED    EKG  EKG Interpretation  Date/Time:  Friday July 05 2016 12:49:10 EST Ventricular Rate:  78 PR Interval:    QRS Duration: 87 QT Interval:  357 QTC Calculation: 407 R Axis:   31 Text Interpretation:  Sinus rhythm Consider left atrial enlargement ST elev, probable normal early repol pattern Confirmed by Ranae Palms  MD, DAVID ([id number]) on 07/05/2016 1:24:42 PM       Radiology Dg Chest 2 View  Result Date: 07/05/2016 CLINICAL DATA:  Chest pain on off for 2 weeks EXAM: CHEST  2 VIEW COMPARISON:  07/10/2012 FINDINGS: The heart size and mediastinal contours are within normal limits. Both lungs are clear. The visualized skeletal structures are unremarkable. IMPRESSION: No active cardiopulmonary disease. Electronically Signed   By: Elige Ko   On: 07/05/2016 13:35    Procedures Procedures (including critical care time)  Medications Ordered in ED Medications  aspirin chewable tablet 324 mg (324 mg Oral Given 07/05/16 1257)     Initial Impression / Assessment and Plan / ED Course  I have reviewed the triage vital signs and the nursing notes.  Pertinent labs & imaging results that were available during my care of the patient were reviewed by me and considered in my medical decision making (see chart for details).  Clinical Course     Patient with chest pain times about 2 weeks. Appointment is negative. Given the length of symptoms, single troponin is thought to be sufficient to rule out MI. Symptoms are worsened lying down, and improved sitting up. He has taken ibuprofen with some good relief. Has no history of ACS, PE, or DVT. EKG shows some  findings consistent with pericarditis. This also fits with the patient's history and physical exam. Patient seen by and discussed with Dr. Ranae Palms, who advises consultation with cardiology to ensure close follow-up. I discussed patient over the telephone with her Skains from cardiology, who agrees with the plan for treatment with ibuprofen and discharge to home now. Cardiology will arrange for close follow-up in their office.  Discussed plan with patient, who understands agrees. He is stable and ready for discharge.  Final Clinical Impressions(s) / ED Diagnoses   Final diagnoses:  Acute pericarditis, unspecified type    New Prescriptions Discharge Medication List as of 07/05/2016  2:33 PM       Roxy Horseman, PA-C 07/05/16 1444    Loren Racer, MD 07/10/16 1243

## 2016-07-18 ENCOUNTER — Encounter: Payer: 59 | Admitting: Cardiology

## 2016-07-22 ENCOUNTER — Encounter: Payer: Self-pay | Admitting: Cardiology

## 2016-07-22 MED FILL — AMLODIPINE BESYLATE 5 MG TA: 5 | 90 days supply | Qty: 90 | Fill #0

## 2016-07-22 MED FILL — LISINOPRIL 40 MG TABLET: 40 | 90 days supply | Qty: 90 | Fill #0

## 2016-07-22 MED FILL — IBUPROFEN 800 MG TABLET: 800 | 7 days supply | Qty: 21 | Fill #0

## 2016-08-07 ENCOUNTER — Encounter: Payer: 59 | Admitting: Physician Assistant

## 2016-10-14 MED FILL — CYCLOBENZAPRINE 5 MG TABLET: 5 | 30 days supply | Qty: 30 | Fill #0

## 2016-10-15 MED FILL — LISINOPRIL 40 MG TABLET: 40 | 90 days supply | Qty: 90 | Fill #1 | Status: TO

## 2016-10-15 MED FILL — AMLODIPINE BESYLATE 5 MG TA: 5 | 90 days supply | Qty: 90 | Fill #1 | Status: TO

## 2017-01-09 DIAGNOSIS — I10 Essential (primary) hypertension: Secondary | ICD-10-CM | POA: Diagnosis not present

## 2017-01-09 DIAGNOSIS — M47812 Spondylosis without myelopathy or radiculopathy, cervical region: Secondary | ICD-10-CM | POA: Diagnosis not present

## 2017-01-09 MED FILL — METHOCARBAMOL 500 MG TABLET: 500 | 15 days supply | Qty: 30 | Fill #0

## 2017-01-21 MED FILL — LISINOPRIL 40 MG TAB: 40 | 90 days supply | Qty: 90 | Fill #0

## 2017-01-21 MED FILL — AMLODIPINE BESYLATE 5 MG TA: 5 | 90 days supply | Qty: 90 | Fill #0

## 2017-04-24 IMAGING — DX DG CHEST 2V
2 series · 2 of 2 positions shown · non-contrast
Comparison: 07/10/2012

CLINICAL DATA: Chest pain on off for 2 weeks

EXAM:
CHEST  2 VIEW

[chest pa]
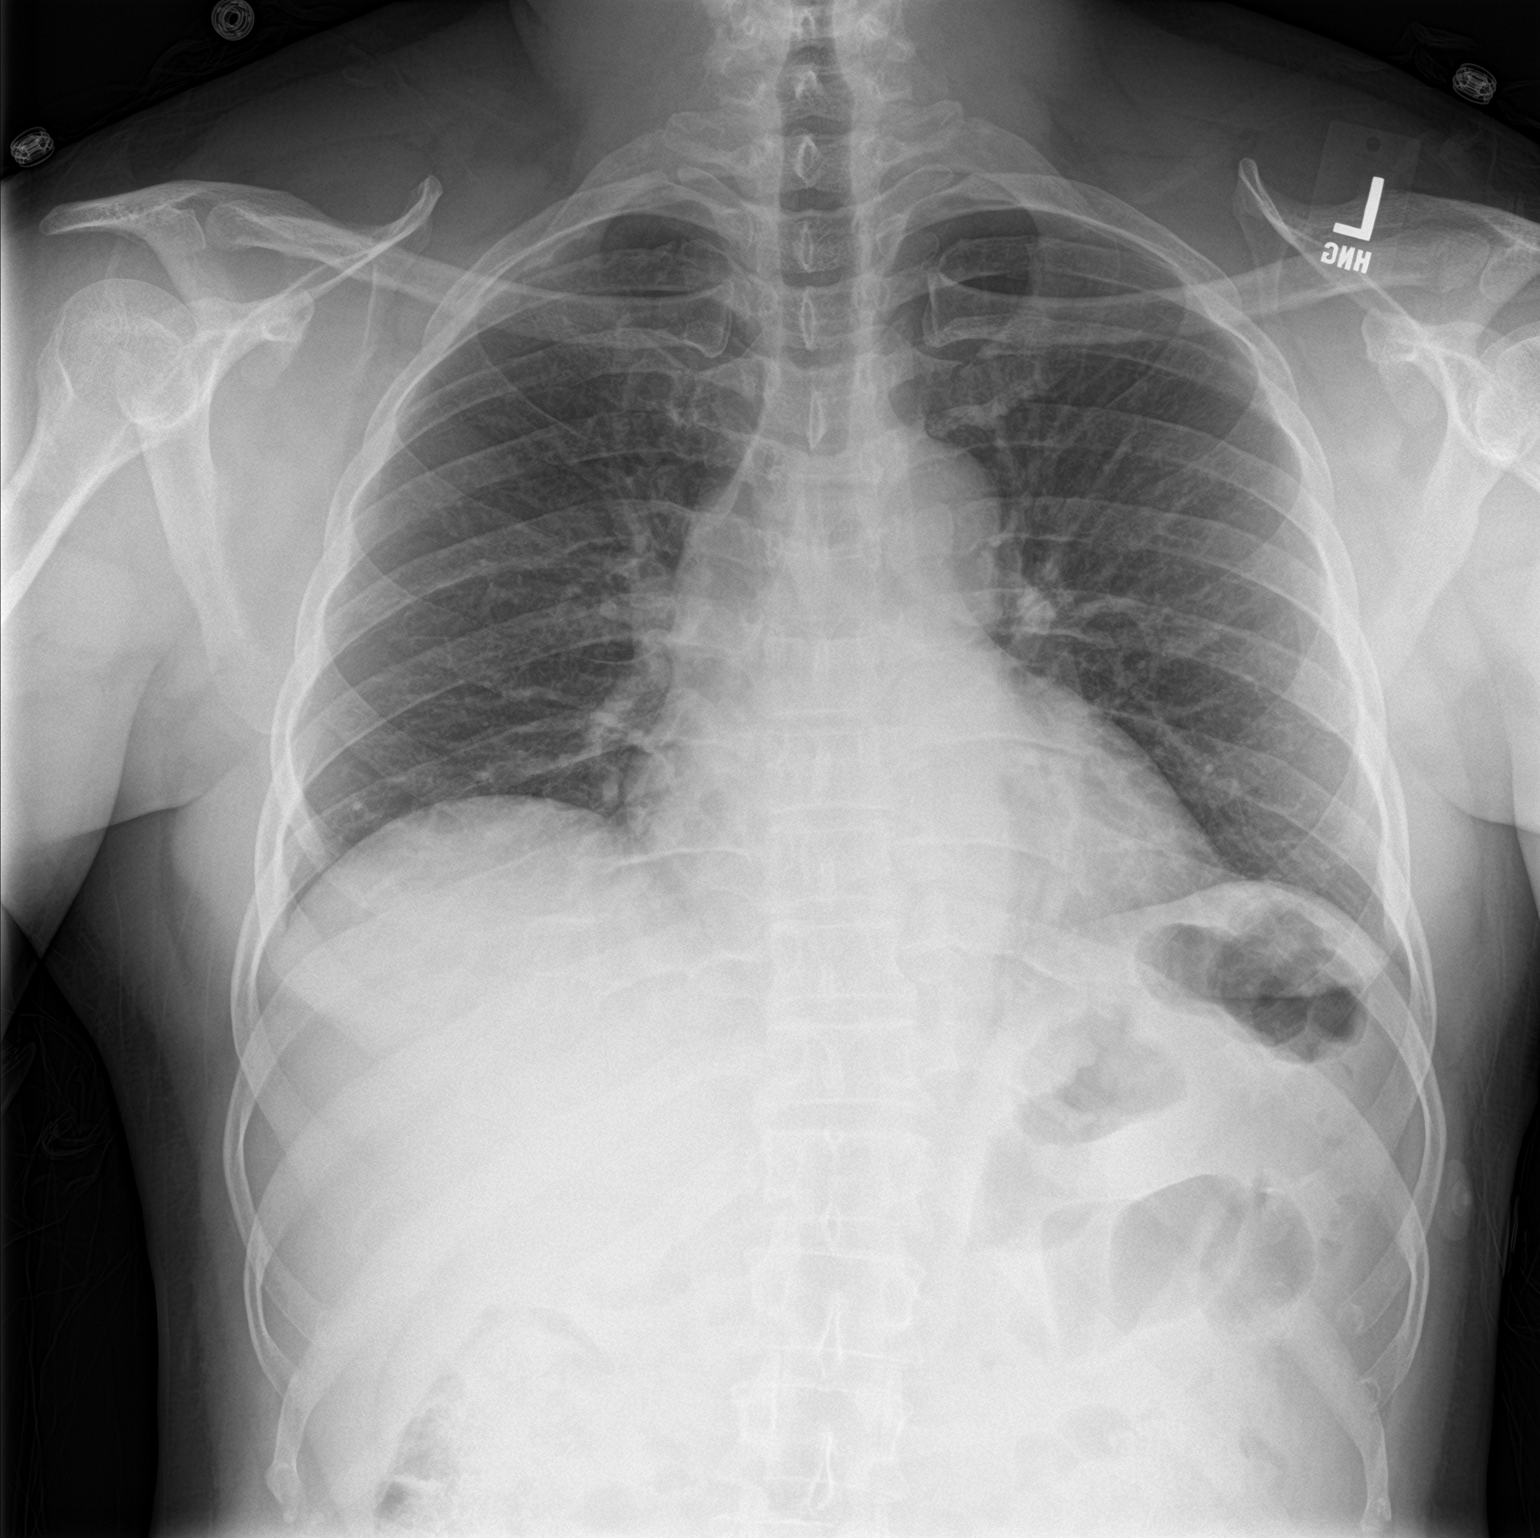

[chest lat]
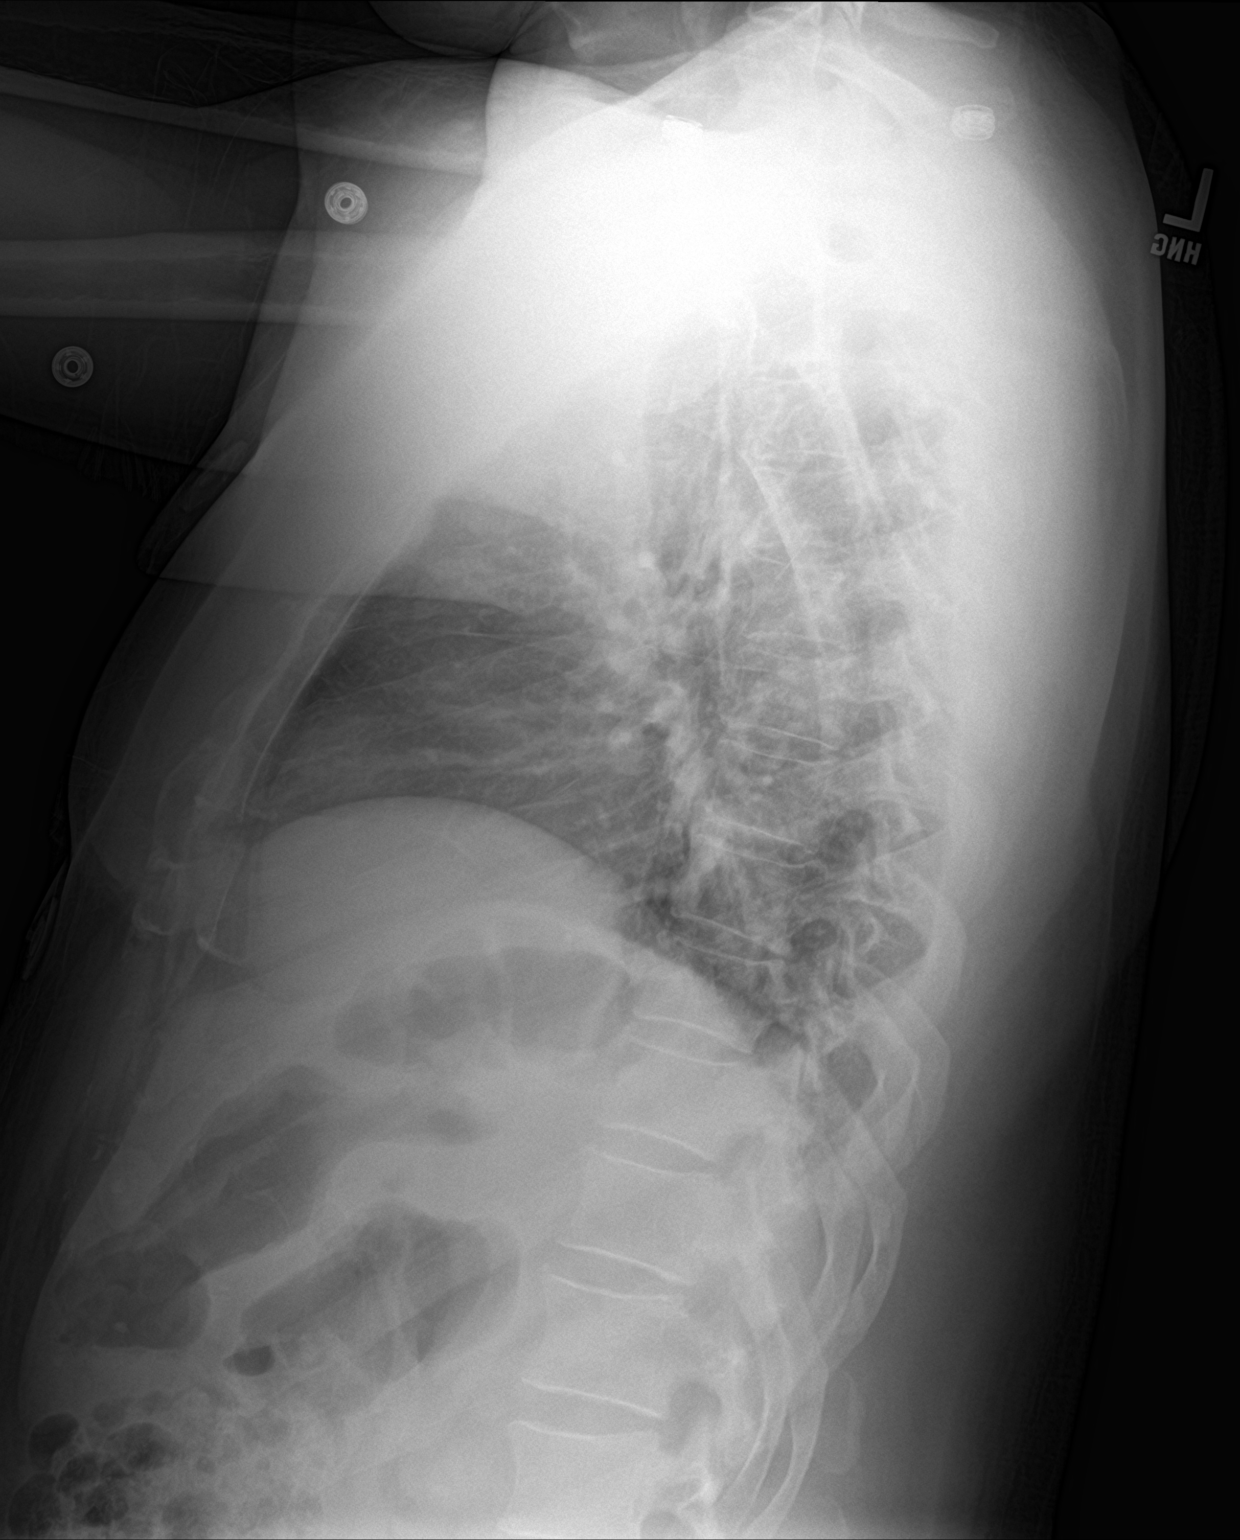

[2 of 2 positions shown; findings below may reference images not displayed]

FINDINGS: The heart size and mediastinal contours are within normal limits.
Both lungs are clear. The visualized skeletal structures are
unremarkable.
IMPRESSION: No active cardiopulmonary disease.

## 2017-05-05 MED FILL — AMLODIPINE BESYLATE 5 MG TA: 5 | 90 days supply | Qty: 90 | Fill #1

## 2017-05-05 MED FILL — LISINOPRIL 40 MG TABLET: 40 | 90 days supply | Qty: 90 | Fill #1

## 2017-06-15 DIAGNOSIS — R05 Cough: Secondary | ICD-10-CM | POA: Diagnosis not present

## 2017-08-06 MED FILL — LISINOPRIL 40 MG TABLET: 40 | 90 days supply | Qty: 90 | Fill #0

## 2017-08-06 MED FILL — AMLODIPINE BESYLATE 5 MG TA: 5 | 90 days supply | Qty: 90 | Fill #0

## 2017-08-13 DIAGNOSIS — Z0000 Encounter for general adult medical examination without abnormal findings: Secondary | ICD-10-CM | POA: Diagnosis not present

## 2017-08-13 DIAGNOSIS — Z23 Encounter for immunization: Secondary | ICD-10-CM | POA: Diagnosis not present

## 2017-08-13 DIAGNOSIS — Z136 Encounter for screening for cardiovascular disorders: Secondary | ICD-10-CM | POA: Diagnosis not present

## 2017-08-13 DIAGNOSIS — R7309 Other abnormal glucose: Secondary | ICD-10-CM | POA: Diagnosis not present

## 2017-08-13 DIAGNOSIS — Z125 Encounter for screening for malignant neoplasm of prostate: Secondary | ICD-10-CM | POA: Diagnosis not present

## 2017-08-13 DIAGNOSIS — Z1389 Encounter for screening for other disorder: Secondary | ICD-10-CM | POA: Diagnosis not present

## 2017-08-13 DIAGNOSIS — Z6830 Body mass index (BMI) 30.0-30.9, adult: Secondary | ICD-10-CM | POA: Diagnosis not present

## 2017-08-13 DIAGNOSIS — I10 Essential (primary) hypertension: Secondary | ICD-10-CM | POA: Diagnosis not present

## 2017-08-26 MED FILL — LIDOCAINE 2% VISCOUS SOLN: 2 | 3 days supply | Qty: 135 | Fill #0

## 2017-11-03 MED FILL — AMLODIPINE BESYLATE 5 MG TA: 5 | 90 days supply | Qty: 90 | Fill #1

## 2017-11-03 MED FILL — LISINOPRIL 40 MG TABLET: 40 | 90 days supply | Qty: 90 | Fill #1

## 2018-02-05 MED FILL — AMLODIPINE BESYLATE 5 MG TA: 5 | 90 days supply | Qty: 90 | Fill #2

## 2018-02-05 MED FILL — LISINOPRIL 40 MG TABLET: 40 | 90 days supply | Qty: 90 | Fill #2

## 2018-05-07 MED FILL — AMLODIPINE BESYLATE 5 MG TA: 5 | 90 days supply | Qty: 90 | Fill #0

## 2018-05-07 MED FILL — LISINOPRIL 40 MG TABLET: 40 | 90 days supply | Qty: 90 | Fill #0

## 2018-08-03 MED FILL — AMLODIPINE BESYLATE 5 MG TA: 5 | 90 days supply | Qty: 90 | Fill #1

## 2018-08-03 MED FILL — LISINOPRIL 40 MG TABLET: 40 | 90 days supply | Qty: 90 | Fill #1

## 2018-08-12 MED FILL — BENZONATATE 200 MG CAPS: 200 | 10 days supply | Qty: 30 | Fill #0

## 2018-09-15 DIAGNOSIS — Z1389 Encounter for screening for other disorder: Secondary | ICD-10-CM | POA: Diagnosis not present

## 2018-09-15 DIAGNOSIS — Z125 Encounter for screening for malignant neoplasm of prostate: Secondary | ICD-10-CM | POA: Diagnosis not present

## 2018-09-15 DIAGNOSIS — M47812 Spondylosis without myelopathy or radiculopathy, cervical region: Secondary | ICD-10-CM | POA: Diagnosis not present

## 2018-09-15 DIAGNOSIS — I10 Essential (primary) hypertension: Secondary | ICD-10-CM | POA: Diagnosis not present

## 2018-09-15 DIAGNOSIS — Z0000 Encounter for general adult medical examination without abnormal findings: Secondary | ICD-10-CM | POA: Diagnosis not present

## 2018-09-15 DIAGNOSIS — E7800 Pure hypercholesterolemia, unspecified: Secondary | ICD-10-CM | POA: Diagnosis not present

## 2018-09-15 MED FILL — ATORVASTATIN 10 MG TABLET: 10 | 90 days supply | Qty: 90 | Fill #0

## 2018-11-09 MED FILL — LISINOPRIL 40 MG TABLET: 40 | 90 days supply | Qty: 90 | Fill #0

## 2018-11-09 MED FILL — AMLODIPINE BESYLATE 5 MG TA: 5 | 90 days supply | Qty: 90 | Fill #0

## 2018-11-16 DIAGNOSIS — E7800 Pure hypercholesterolemia, unspecified: Secondary | ICD-10-CM | POA: Diagnosis not present

## 2019-02-10 MED FILL — LISINOPRIL 40 MG TABS: 40 | 90 days supply | Qty: 90 | Fill #0

## 2019-02-10 MED FILL — AMLODIPINE BESYLATE 5 MG TA: 5 | 90 days supply | Qty: 90 | Fill #0

## 2019-03-31 DIAGNOSIS — M47812 Spondylosis without myelopathy or radiculopathy, cervical region: Secondary | ICD-10-CM | POA: Diagnosis not present

## 2019-03-31 DIAGNOSIS — I10 Essential (primary) hypertension: Secondary | ICD-10-CM | POA: Diagnosis not present

## 2019-03-31 DIAGNOSIS — Z6832 Body mass index (BMI) 32.0-32.9, adult: Secondary | ICD-10-CM | POA: Diagnosis not present

## 2019-03-31 DIAGNOSIS — E7800 Pure hypercholesterolemia, unspecified: Secondary | ICD-10-CM | POA: Diagnosis not present

## 2019-05-11 MED FILL — AMLODIPINE BESYLATE 5 MG TA: 5 | 90 days supply | Qty: 90 | Fill #1

## 2019-05-11 MED FILL — LISINOPRIL 40 MG TABS: 40 | 90 days supply | Qty: 90 | Fill #1

## 2019-08-11 MED FILL — LISINOPRIL 40 MG TABLET: 40 | 90 days supply | Qty: 90 | Fill #2

## 2019-08-11 MED FILL — AMLODIPINE BESYLATE 5 MG TA: 5 | 90 days supply | Qty: 90 | Fill #2

## 2019-09-28 ENCOUNTER — Other Ambulatory Visit (HOSPITAL_COMMUNITY): Payer: Self-pay | Admitting: Internal Medicine

## 2019-09-28 DIAGNOSIS — I10 Essential (primary) hypertension: Secondary | ICD-10-CM | POA: Diagnosis not present

## 2019-09-28 DIAGNOSIS — Z6832 Body mass index (BMI) 32.0-32.9, adult: Secondary | ICD-10-CM | POA: Diagnosis not present

## 2019-09-28 DIAGNOSIS — Z1389 Encounter for screening for other disorder: Secondary | ICD-10-CM | POA: Diagnosis not present

## 2019-09-28 DIAGNOSIS — Z0000 Encounter for general adult medical examination without abnormal findings: Secondary | ICD-10-CM | POA: Diagnosis not present

## 2019-09-28 DIAGNOSIS — Z125 Encounter for screening for malignant neoplasm of prostate: Secondary | ICD-10-CM | POA: Diagnosis not present

## 2019-09-28 DIAGNOSIS — E782 Mixed hyperlipidemia: Secondary | ICD-10-CM | POA: Diagnosis not present

## 2020-01-31 MED FILL — AMLODIPINE BESYLATE 5 MG TA: 5 | 90 days supply | Qty: 90 | Fill #1

## 2020-01-31 MED FILL — LISINOPRIL 40 MG TABS: 40 | 90 days supply | Qty: 90 | Fill #1

## 2020-02-01 DIAGNOSIS — E7800 Pure hypercholesterolemia, unspecified: Secondary | ICD-10-CM | POA: Diagnosis not present

## 2020-02-01 DIAGNOSIS — R748 Abnormal levels of other serum enzymes: Secondary | ICD-10-CM | POA: Diagnosis not present

## 2020-04-28 MED FILL — ATORVASTATIN CALCIUM 10 MG: 10 | 90 days supply | Qty: 90 | Fill #1

## 2020-04-28 MED FILL — LISINOPRIL 40 MG TABS: 40 | 90 days supply | Qty: 90 | Fill #2

## 2020-04-28 MED FILL — AMLODIPINE BESYLATE 5 MG TA: 5 | 90 days supply | Qty: 90 | Fill #2

## 2020-08-04 MED FILL — LISINOPRIL 40 MG TABS: 40 | 90 days supply | Qty: 90 | Fill #3

## 2020-08-04 MED FILL — AMLODIPINE BESYLATE 5 MG TA: 5 | 90 days supply | Qty: 90 | Fill #3

## 2020-11-14 ENCOUNTER — Other Ambulatory Visit (HOSPITAL_COMMUNITY): Payer: Self-pay

## 2020-11-14 ENCOUNTER — Other Ambulatory Visit: Payer: Self-pay | Admitting: Internal Medicine

## 2020-11-15 DIAGNOSIS — Z20822 Contact with and (suspected) exposure to covid-19: Secondary | ICD-10-CM | POA: Diagnosis not present

## 2020-11-16 ENCOUNTER — Other Ambulatory Visit (HOSPITAL_COMMUNITY): Payer: Self-pay

## 2020-11-16 ENCOUNTER — Other Ambulatory Visit: Payer: Self-pay | Admitting: Internal Medicine

## 2020-11-17 ENCOUNTER — Other Ambulatory Visit (HOSPITAL_COMMUNITY): Payer: Self-pay

## 2020-11-22 ENCOUNTER — Other Ambulatory Visit: Payer: Self-pay | Admitting: Internal Medicine

## 2020-11-22 ENCOUNTER — Other Ambulatory Visit (HOSPITAL_COMMUNITY): Payer: Self-pay

## 2020-11-24 ENCOUNTER — Other Ambulatory Visit (HOSPITAL_COMMUNITY): Payer: Self-pay

## 2020-12-20 ENCOUNTER — Other Ambulatory Visit (HOSPITAL_COMMUNITY): Payer: Self-pay

## 2020-12-20 ENCOUNTER — Emergency Department (HOSPITAL_COMMUNITY): Payer: 59

## 2020-12-20 ENCOUNTER — Encounter (HOSPITAL_COMMUNITY): Payer: Self-pay | Admitting: *Deleted

## 2020-12-20 ENCOUNTER — Emergency Department (HOSPITAL_COMMUNITY)
Admission: EM | Admit: 2020-12-20 | Discharge: 2020-12-20 | Disposition: A | Discharge status: Home / Self Care | Payer: 59 | Attending: Emergency Medicine | Admitting: Emergency Medicine

## 2020-12-20 ENCOUNTER — Other Ambulatory Visit: Payer: Self-pay

## 2020-12-20 DIAGNOSIS — I10 Essential (primary) hypertension: Secondary | ICD-10-CM | POA: Insufficient documentation

## 2020-12-20 DIAGNOSIS — M19012 Primary osteoarthritis, left shoulder: Secondary | ICD-10-CM | POA: Diagnosis not present

## 2020-12-20 DIAGNOSIS — M25512 Pain in left shoulder: Secondary | ICD-10-CM | POA: Insufficient documentation

## 2020-12-20 DIAGNOSIS — Z79899 Other long term (current) drug therapy: Secondary | ICD-10-CM | POA: Insufficient documentation

## 2020-12-20 DIAGNOSIS — X500XXA Overexertion from strenuous movement or load, initial encounter: Secondary | ICD-10-CM | POA: Diagnosis not present

## 2020-12-20 MED ORDER — METHOCARBAMOL 500 MG PO TABS
500.0000 mg | ORAL_TABLET | Freq: Two times a day (BID) | ORAL | 0 refills | Status: AC
  Filled 2020-12-20: qty 20, 10d supply, fill #0

## 2020-12-20 MED ORDER — HYDROCODONE-ACETAMINOPHEN 5-325 MG PO TABS
1.0000 | ORAL_TABLET | Freq: Once | ORAL | Status: AC
  Administered 2020-12-20: 1 via ORAL
  Filled 2020-12-20: qty 1

## 2020-12-20 NOTE — ED Provider Notes (Signed)
Emergency Medicine Provider Triage Evaluation Note  Bradley Robles 45 y.o. male was evaluated in triage.  Pt complains of left shoulder pain.  He reports that yesterday, he was something that was about 72 pounds.  He states that since then he has been having pain in the left shoulder.  He states it hurts more when he tries to move it, lift it up.  He has been taking ibuprofen and he states it helped temporarily but then pain returned.  No redness or swelling.  No numbness/weakness.  No chest pain.   Review of Systems  Positive: Left shoulder pain Negative: Numbness/weakness, redness/swelling.  Physical Exam  BP 134/82   Pulse 70   Temp 98.2 F (36.8 C) (Oral)   Resp 18   Ht 5\' 4"  (1.626 m)   Wt 65.8 kg   SpO2 100%   BMI 24.89 kg/m  Gen:   Awake, no distress   HEENT:  Atraumatic  Resp:  Normal effort  Cardiac:  Normal rate.  2+ radial pulse bilaterally. Abd:   Nondistended, nontender  MSK:   Diffuse tenderness palpation left shoulder.  No overlying warmth, erythema.  Limited range of motion secondary to pain.  Positive Neer's impingement, Hawkins.  Difficulty with empty can test. Neuro:  Speech clear   Other:      Medical Decision Making  Medically screening exam initiated at 3:31 PM  Appropriate orders placed.  Bradley Robles was informed that the remainder of the evaluation will be completed by another provider, this initial triage assessment does not replace that evaluation. They are counseled that they will need to remain in the ED until the completion of their workup, including full H&P and results of any tests.  Risks of leaving the emergency department prior to completion of treatment were discussed. Patient was advised to inform ED staff if they are leaving before their treatment is complete. The patient acknowledged these risks and time was allowed for questions.     The patient appears stable so that the remainder of the MSE may be completed by another  provider.    Clinical Impression  Shoulder pain.   Portions of this note were generated with Joyice Faster. Dictation errors may occur despite best attempts at proofreading.     Scientist, clinical (histocompatibility and immunogenetics), PA-C 12/20/20 1532    12/22/20, MD 12/20/20 [phone]

## 2020-12-20 NOTE — Discharge Instructions (Addendum)
As we discussed, your x-ray today showed some possible calcium tendinosis around your supraspinatus tendon.  As we discussed, this could be indicative of tendinitis versus rotator cuff issues.  You can take Tylenol or Ibuprofen as directed for pain. You can alternate Tylenol and Ibuprofen every 4 hours. If you take Tylenol at 1pm, then you can take Ibuprofen at 5pm. Then you can take Tylenol again at 9pm.   Take Robaxin as prescribed. This medication will make you drowsy so do not drive or drink alcohol when taking it.  Wear the sling for support and stabilization.  Do not wear it 24/7 as this can cause frozen shoulder syndrome.  When your pain is improved, gently do range of motion exercises at home.  If you do not have any improvement, follow-up with referred orthopedic doctor.  Return to emergency department for any worsening pain, numbness/weakness, redness, fevers or any other worsening or concerning symptoms.

## 2020-12-20 NOTE — ED Triage Notes (Signed)
Pt complains of left arm pain since lifting a heavy object.

## 2020-12-20 NOTE — ED Provider Notes (Signed)
[facility] Provider Note   CSN: [id number] Arrival date & time: 12/20/20  1518     History Chief Complaint  Patient presents with   Arm Pain    Bradley Robles is a 45 y.o. male who presents for evaluation of left shoulder pain.  He reports that about a day or so ago, he was lifting something that he estimates was about 72 pounds.  He states that afterwards, shoulder started hurting.  He states is primarily in the anterior aspect.  It hurts more when he tries to move it.  He did not fall.  He reports taking ibuprofen once and states that temporarily helped the pain but then it returned.  He has not noted any overlying warmth, erythema.  Denies any numbness/weakness, fevers, chest pain . The history is provided by the patient.      Past Medical History:  Diagnosis Date   Hypertension     Patient Active Problem List   Diagnosis Date Noted   MALARIA 12/22/2009    History reviewed. No pertinent surgical history.     No family history on file.  Social History   Tobacco Use   Smoking status: Never   Smokeless tobacco: Never  Substance Use Topics   Alcohol use: Yes    Comment: rarely   Drug use: No    Home Medications Prior to Admission medications   Medication Sig Start Date End Date Taking? Authorizing Provider  methocarbamol (ROBAXIN) 500 MG tablet Take 1 tablet (500 mg total) by mouth 2 (two) times daily. 12/20/20  Yes Graciella Freer A, PA-C  amLODipine (NORVASC) 5 MG tablet Take 5 mg by mouth daily.    [provider]  amLODipine (NORVASC) 5 MG tablet TAKE 1 TABLET BY MOUTH ONCE DAILY. 09/28/19 09/27/20  Georgann Housekeeper, MD  diazepam (VALIUM) 5 MG tablet Take 1 tablet (5 mg total) by mouth 2 (two) times daily. 06/24/16   Barrett Henle, PA-C  ibuprofen (ADVIL,MOTRIN) 800 MG tablet Take 1 tablet (800 mg total) by mouth 3 (three) times daily. 07/05/16   Roxy Horseman, PA-C  lisinopril (PRINIVIL) 10 MG tablet Take  1 tablet (10 mg total) by mouth daily. 07/10/12   Gilda Crease, MD  lisinopril (ZESTRIL) 40 MG tablet TAKE 1 TABLET BY MOUTH ONCE DAILY. 09/28/19 09/27/20  Georgann Housekeeper, MD  Multiple Vitamins-Minerals (MEGA MULTIVITAMIN FOR MEN PO) Take 1 tablet by mouth daily.    [provider]  oxyCODONE-acetaminophen (PERCOCET/ROXICET) 5-325 MG tablet Take 1 tablet by mouth every 4 (four) hours as needed for severe pain. 06/24/16   Barrett Henle, PA-C  traMADol (ULTRAM) 50 MG tablet Take 1 tablet (50 mg total) by mouth every 6 (six) hours as needed for pain. Patient not taking: Reported on 06/24/2016 07/10/12   Gilda Crease, MD    Allergies    Patient has no known allergies.  Review of Systems   Review of Systems  Constitutional:  Negative for fever.  Cardiovascular:  Negative for chest pain.  Musculoskeletal:        Shoulder pain  Skin:  Negative for color change.  Neurological:  Negative for weakness and numbness.  All other systems reviewed and are negative.  Physical Exam Updated Vital Signs BP (!) 170/113   Pulse (!) 101   Temp 99.2 F (37.3 C) (Oral)   Resp 20   SpO2 97%   Physical Exam Vitals and nursing note reviewed.  Constitutional:  Appearance: He is well-developed.  HENT:     Head: Normocephalic and atraumatic.  Eyes:     General: No scleral icterus.       Right eye: No discharge.        Left eye: No discharge.     Conjunctiva/sclera: Conjunctivae normal.  Cardiovascular:     Pulses:          Radial pulses are 2+ on the right side and 2+ on the left side.  Pulmonary:     Effort: Pulmonary effort is normal.  Musculoskeletal:     Comments: Tenderness palpation in anterior aspect of left shoulder.  No overlying warmth, erythema, edema.  No deformity or crepitus noted.  Limited range of motion secondary to pain.  Unable to complete Neer's impingement secondary to pain.  Positive Hawkins, supraspinatus, liftoff test.  No bony  tenderness in left elbow, forearm, left wrist.  No tenderness palpation noted to right upper extremity.  Full range of motion right upper extremity intact without difficulty.  Skin:    General: Skin is warm and dry.     Comments: Good distal cap refill. LUE is not dusky in appearance or cool to touch.  Neurological:     Mental Status: He is alert.     Comments: Sensation intact along major nerve distributions of BUE  Psychiatric:        Speech: Speech normal.        Behavior: Behavior normal.    ED Results / Procedures / Treatments   Labs (all labs ordered are listed, but only abnormal results are displayed) Labs Reviewed - No data to display  EKG None  Radiology DG Shoulder Left  Result Date: 12/20/2020 CLINICAL DATA:  Shoulder pain EXAM: LEFT SHOULDER - 2+ VIEW COMPARISON:  None. FINDINGS: No fracture or dislocation. Mineralization along the supraspinatus tendon insertion as can be seen with calcific tendinosis. Mild degenerative changes of the acromioclavicular joint. Soft tissues are normal. IMPRESSION: Mineralization along the supraspinatus tendon insertion as can be seen with calcific tendinosis. Electronically Signed   By: Elige Ko   On: 12/20/2020 16:05    Procedures Procedures   Medications Ordered in ED Medications  HYDROcodone-acetaminophen (NORCO/VICODIN) 5-325 MG per tablet 1 tablet (1 tablet Oral Given 12/20/20 1721)    ED Course  I have reviewed the triage vital signs and the nursing notes.  Pertinent labs & imaging results that were available during my care of the patient were reviewed by me and considered in my medical decision making (see chart for details).    MDM Rules/Calculators/A&P                          45 year old male who presents for evaluation of left shoulder pain.  Reports pain began after he lifted something that weighed approximately 70 pounds.  Reports pain with movement.  Took ibuprofen at home but then pain came back.  No  numbness/weakness.  No fevers.  No chest pain.  On initial arrival, he is afebrile, nontoxic-appearing.  Vital signs are stable.  He is neurovascularly intact.  He has tenderness palpation in anterior aspect of the shoulder.  No deformity or crepitus noted.  No overlying warmth, erythema, edema.  He is unable to complete Neer's impingement test.  Positive Hawkins, liftoff, supraspinatus test.  Concern for musculoskeletal etiology versus rotator cuff pathology.  Low suspicion for fracture or dislocation.  History/physical exam concerning for ischemic limb, septic arthritis.  Will obtain x-ray  to rule out any acute bony abnormality.  X-ray reviewed.  There is mineralization along the supraspinatus tendon insertion that can be seen with calcific tendinosis.  I discussed results with patient.  I discussed with him that this could be representative tendinitis versus rotator cuff pathology.  We will plan to do Robaxin.  Additionally, patient instructed on arm sling use.  We will give him outpatient orthopedic. At this time, patient exhibits no emergent life-threatening condition that require further evaluation in ED. Patient had ample opportunity for questions and discussion. All patient's questions were answered with full understanding. Strict return precautions discussed. Patient expresses understanding and agreement to plan.   Portions of this note were generated with Scientist, clinical (histocompatibility and immunogenetics). Dictation errors may occur despite best attempts at proofreading.     Final Clinical Impression(s) / ED Diagnoses Final diagnoses:  Acute pain of left shoulder    Rx / DC Orders ED Discharge Orders          Ordered    methocarbamol (ROBAXIN) 500 MG tablet  2 times daily        12/20/20 1659             Rosana Hoes 12/20/20 2119    Pollyann Savoy, MD 12/20/20 2226

## 2020-12-28 ENCOUNTER — Other Ambulatory Visit (HOSPITAL_COMMUNITY): Payer: Self-pay

## 2020-12-28 MED ORDER — AMLODIPINE BESYLATE 5 MG PO TABS
ORAL_TABLET | ORAL | 0 refills | Status: DC
  Filled 2020-12-28: qty 60, 60d supply, fill #0

## 2020-12-28 MED ORDER — LISINOPRIL 40 MG PO TABS
ORAL_TABLET | ORAL | 0 refills | Status: DC
  Filled 2020-12-28: qty 60, 60d supply, fill #0

## 2021-02-21 ENCOUNTER — Other Ambulatory Visit (HOSPITAL_COMMUNITY): Payer: Self-pay

## 2021-02-21 MED ORDER — LISINOPRIL 40 MG PO TABS
40.0000 mg | ORAL_TABLET | Freq: Every day | ORAL | 0 refills | Status: DC
  Filled 2021-02-21: qty 60, 60d supply, fill #0

## 2021-02-21 MED ORDER — AMLODIPINE BESYLATE 5 MG PO TABS
5.0000 mg | ORAL_TABLET | Freq: Every day | ORAL | 0 refills | Status: DC
  Filled 2021-02-21: qty 60, 60d supply, fill #0

## 2021-05-01 ENCOUNTER — Other Ambulatory Visit (HOSPITAL_COMMUNITY): Payer: Self-pay

## 2021-05-01 MED ORDER — AMLODIPINE BESYLATE 5 MG PO TABS
5.0000 mg | ORAL_TABLET | Freq: Every day | ORAL | 0 refills | Status: DC
  Filled 2021-05-01: qty 30, 30d supply, fill #0

## 2021-05-01 MED ORDER — LISINOPRIL 40 MG PO TABS
40.0000 mg | ORAL_TABLET | Freq: Every day | ORAL | 0 refills | Status: DC
  Filled 2021-05-01: qty 30, 30d supply, fill #0

## 2021-05-04 ENCOUNTER — Other Ambulatory Visit (HOSPITAL_COMMUNITY): Payer: Self-pay

## 2021-05-04 MED ORDER — LISINOPRIL 40 MG PO TABS
40.0000 mg | ORAL_TABLET | Freq: Every day | ORAL | 0 refills | Status: DC
  Filled 2021-05-04 – 2021-06-04 (×2): qty 90, 90d supply, fill #0

## 2021-05-04 MED ORDER — AMLODIPINE BESYLATE 5 MG PO TABS
5.0000 mg | ORAL_TABLET | Freq: Every day | ORAL | 0 refills | Status: DC
  Filled 2021-05-04 – 2021-06-04 (×2): qty 90, 90d supply, fill #0

## 2021-06-04 ENCOUNTER — Other Ambulatory Visit (HOSPITAL_COMMUNITY): Payer: Self-pay

## 2021-08-20 ENCOUNTER — Other Ambulatory Visit (HOSPITAL_COMMUNITY): Payer: Self-pay

## 2021-08-20 MED ORDER — AMLODIPINE BESYLATE 5 MG PO TABS
5.0000 mg | ORAL_TABLET | Freq: Every day | ORAL | 0 refills | Status: DC
  Filled 2021-08-20: qty 90, 90d supply, fill #0

## 2021-08-20 MED ORDER — LISINOPRIL 40 MG PO TABS
40.0000 mg | ORAL_TABLET | Freq: Every day | ORAL | 0 refills | Status: DC
  Filled 2021-08-20: qty 90, 90d supply, fill #0

## 2021-10-09 IMAGING — CR DG SHOULDER 2+V*L*
3 series · 3 of 3 positions shown · non-contrast
Comparison: None.

CLINICAL DATA: Shoulder pain

EXAM:
LEFT SHOULDER - 2+ VIEW

[w shoulder external left]
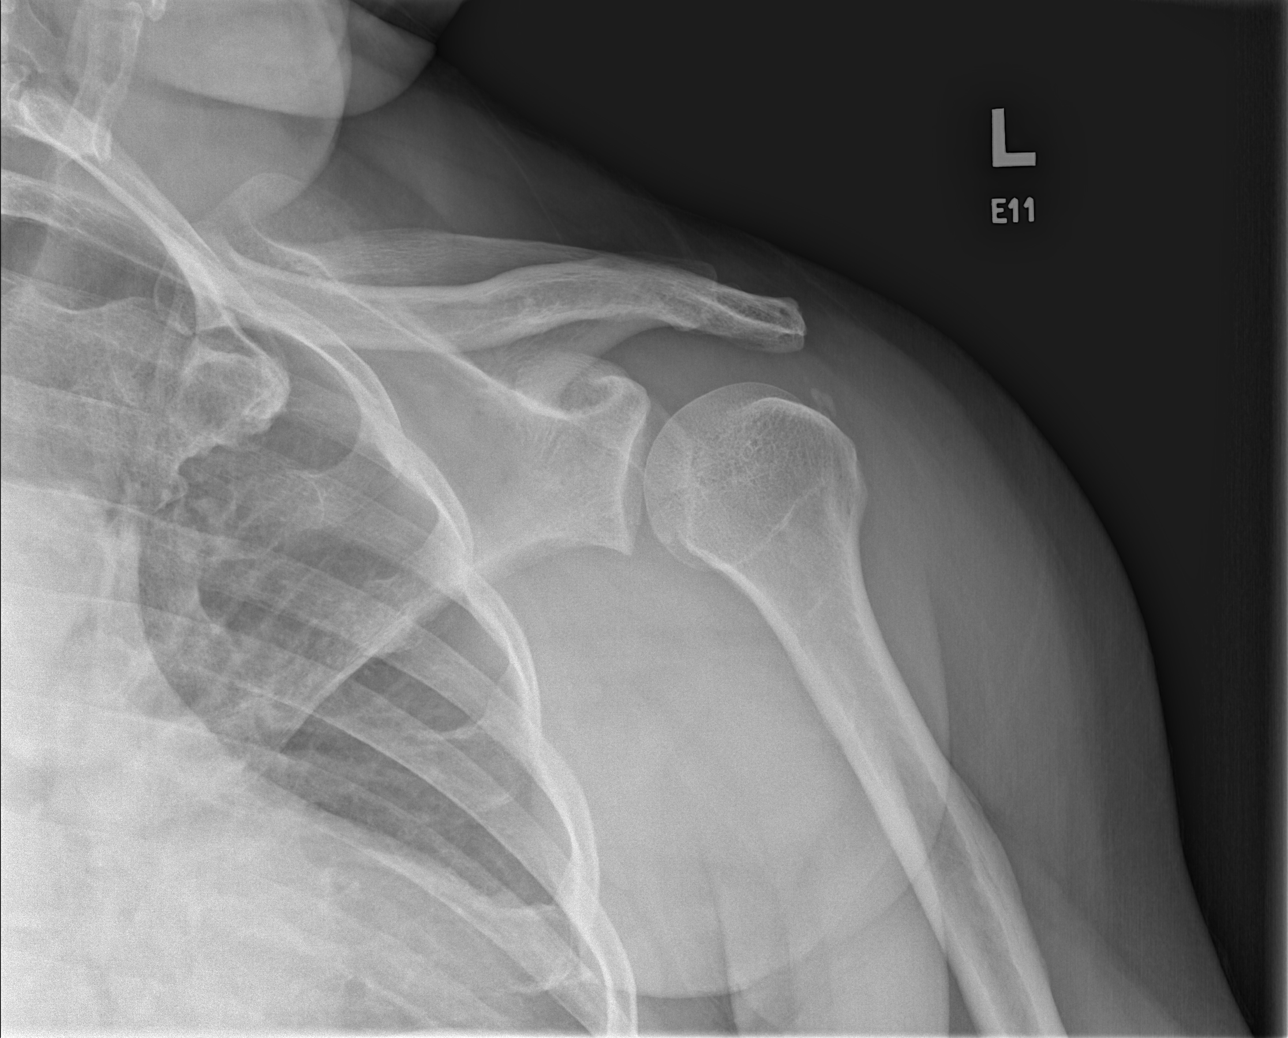

[w shoulder y-view left]
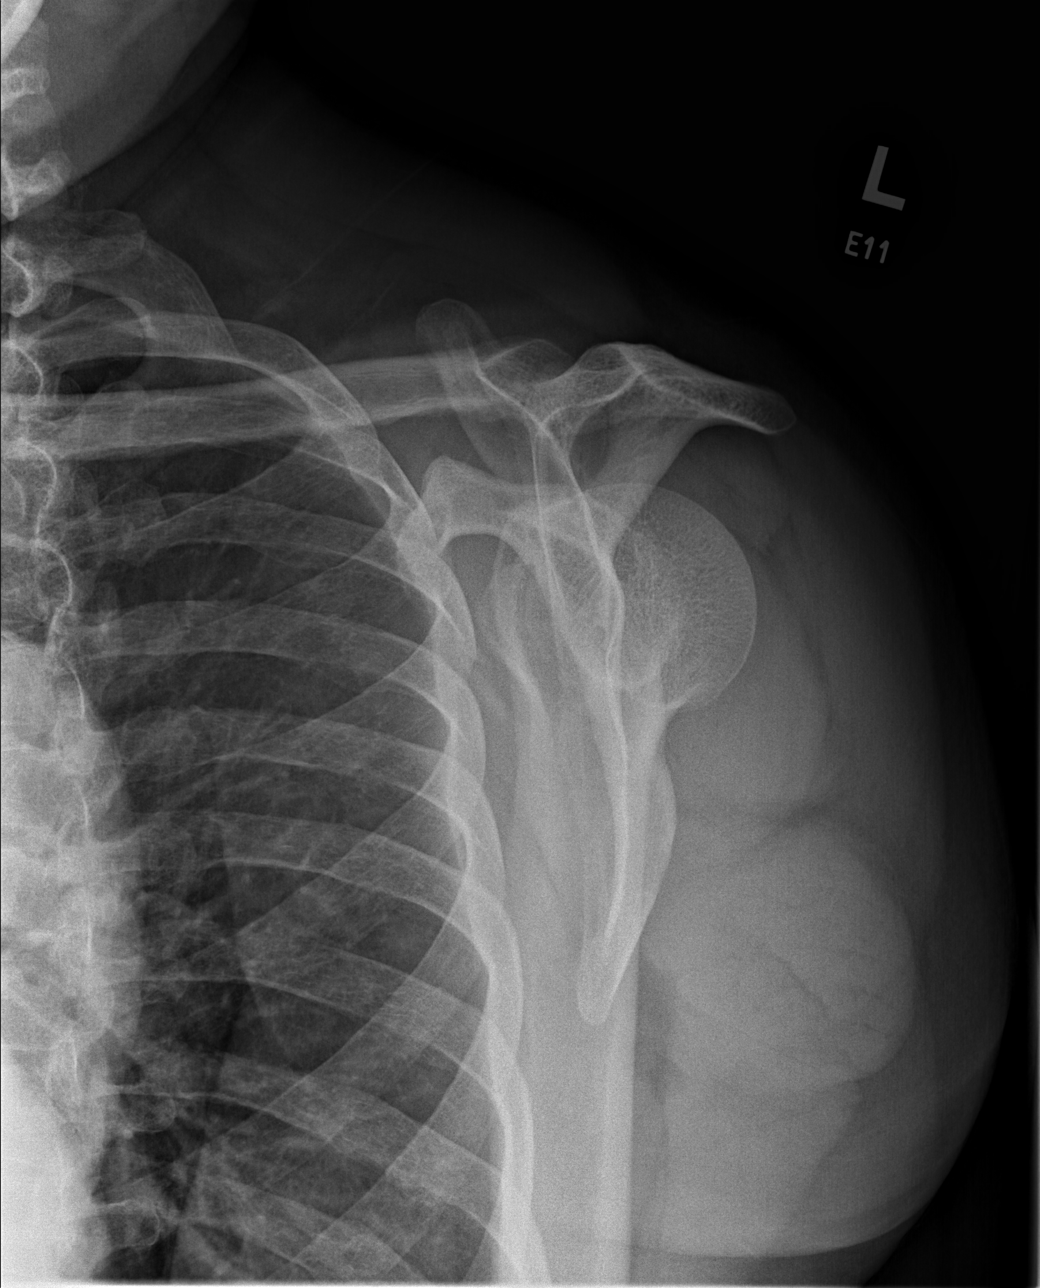

[x shoulder axillary left]
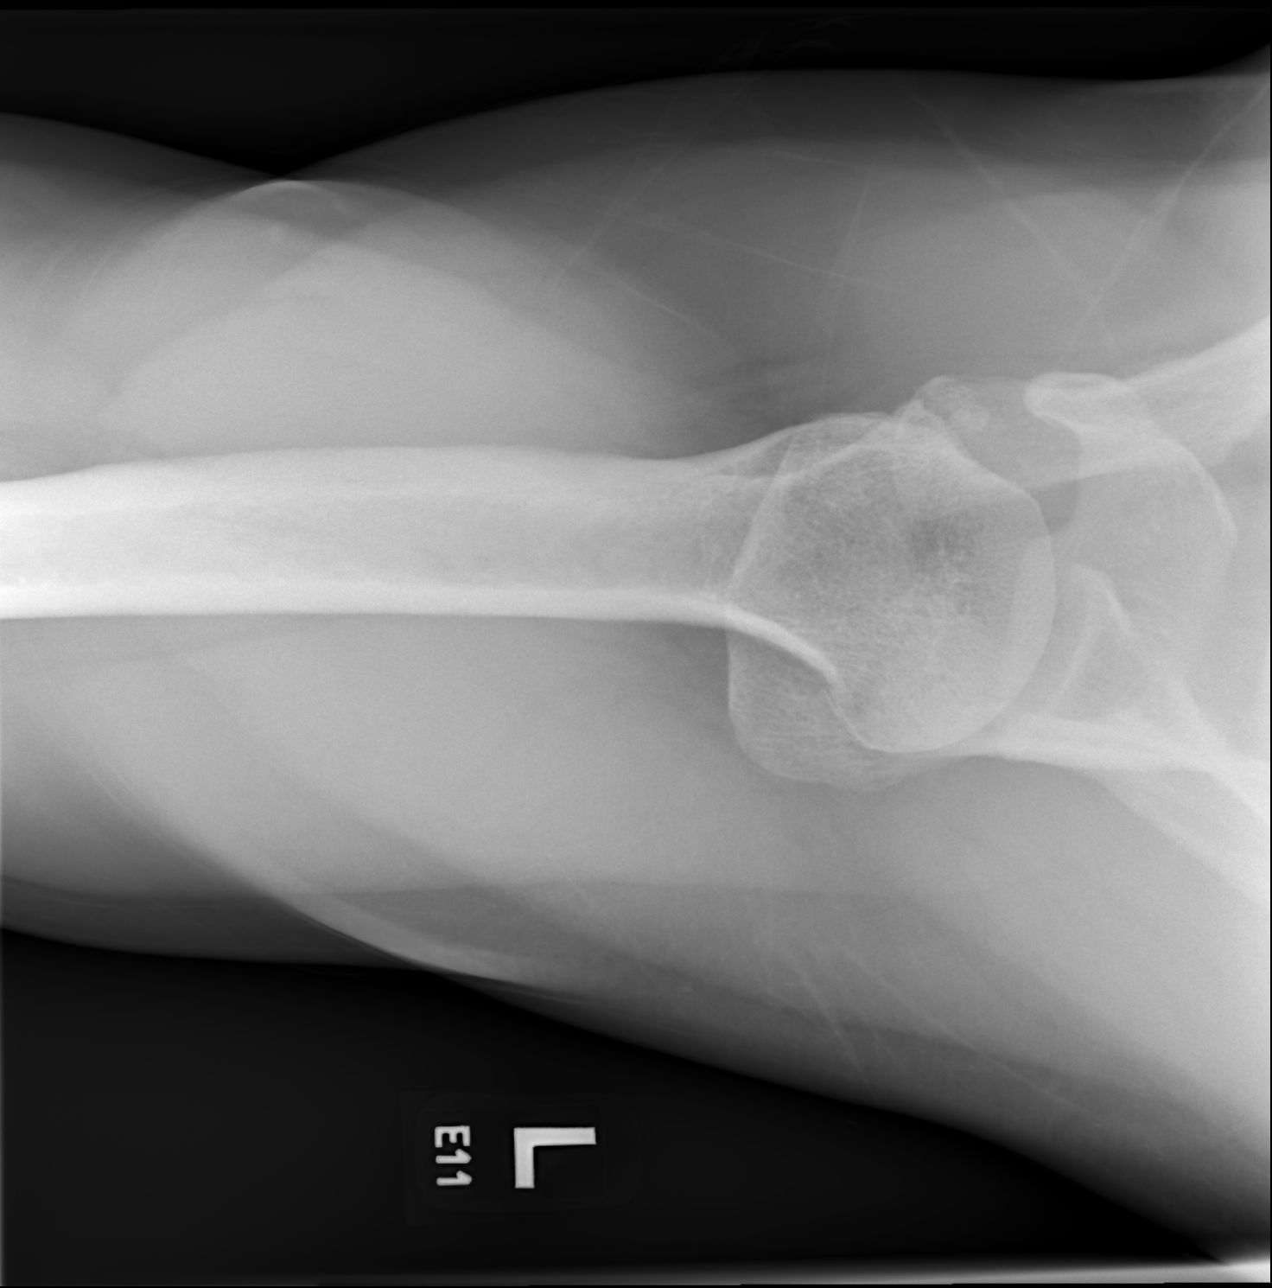

[3 of 3 positions shown; findings below may reference images not displayed]

FINDINGS: No fracture or dislocation. Mineralization along the supraspinatus
tendon insertion as can be seen with calcific tendinosis. Mild
degenerative changes of the acromioclavicular joint. Soft tissues
are normal.
IMPRESSION: Mineralization along the supraspinatus tendon insertion as can be
seen with calcific tendinosis.

## 2021-11-01 ENCOUNTER — Other Ambulatory Visit (HOSPITAL_COMMUNITY): Payer: Self-pay

## 2021-11-01 MED ORDER — CYCLOBENZAPRINE HCL 10 MG PO TABS
10.0000 mg | ORAL_TABLET | Freq: Every evening | ORAL | 6 refills | Status: AC | PRN
  Filled 2021-11-01: qty 30, 30d supply, fill #0
  Filled 2022-02-28: qty 30, 30d supply, fill #1
  Filled 2022-08-26: qty 30, 30d supply, fill #2

## 2021-11-01 MED ORDER — AMLODIPINE BESYLATE 10 MG PO TABS
10.0000 mg | ORAL_TABLET | Freq: Every day | ORAL | 4 refills | Status: AC
  Filled 2021-11-01: qty 90, 90d supply, fill #0
  Filled 2022-02-15: qty 90, 90d supply, fill #1
  Filled 2022-08-26: qty 90, 90d supply, fill #2

## 2021-11-01 MED ORDER — LISINOPRIL 40 MG PO TABS
40.0000 mg | ORAL_TABLET | Freq: Every day | ORAL | 4 refills | Status: AC
  Filled 2021-11-01: qty 90, 90d supply, fill #0
  Filled 2022-02-15: qty 90, 90d supply, fill #1
  Filled 2022-08-26: qty 90, 90d supply, fill #2

## 2021-11-02 ENCOUNTER — Other Ambulatory Visit (HOSPITAL_COMMUNITY): Payer: Self-pay

## 2021-11-02 MED ORDER — ATORVASTATIN CALCIUM 10 MG PO TABS
10.0000 mg | ORAL_TABLET | Freq: Every day | ORAL | 3 refills | Status: AC
  Filled 2021-11-02: qty 90, 90d supply, fill #0
  Filled 2022-02-28: qty 90, 90d supply, fill #1
  Filled 2022-08-26: qty 90, 90d supply, fill #2

## 2021-11-05 ENCOUNTER — Other Ambulatory Visit (HOSPITAL_COMMUNITY): Payer: Self-pay

## 2022-01-14 ENCOUNTER — Other Ambulatory Visit (HOSPITAL_COMMUNITY): Payer: Self-pay

## 2022-01-14 MED ORDER — HYDROCODONE BIT-HOMATROP MBR 5-1.5 MG/5ML PO SOLN
5.0000 mL | Freq: Every evening | ORAL | 0 refills | Status: AC | PRN
  Filled 2022-01-14: qty 100, 20d supply, fill #0

## 2022-01-14 MED ORDER — AZITHROMYCIN 250 MG PO TABS
ORAL_TABLET | ORAL | 0 refills | Status: AC
  Filled 2022-01-14: qty 6, 5d supply, fill #0

## 2022-02-15 ENCOUNTER — Other Ambulatory Visit (HOSPITAL_COMMUNITY): Payer: Self-pay

## 2022-02-28 ENCOUNTER — Other Ambulatory Visit (HOSPITAL_COMMUNITY): Payer: Self-pay

## 2022-04-11 ENCOUNTER — Encounter: Payer: Self-pay | Admitting: Podiatry

## 2022-04-11 ENCOUNTER — Ambulatory Visit (INDEPENDENT_AMBULATORY_CARE_PROVIDER_SITE_OTHER): Payer: No Typology Code available for payment source | Admitting: Podiatry

## 2022-04-11 DIAGNOSIS — M2170 Unequal limb length (acquired), unspecified site: Secondary | ICD-10-CM

## 2022-04-11 DIAGNOSIS — M2141 Flat foot [pes planus] (acquired), right foot: Secondary | ICD-10-CM

## 2022-04-11 DIAGNOSIS — M2142 Flat foot [pes planus] (acquired), left foot: Secondary | ICD-10-CM

## 2022-04-11 NOTE — Progress Notes (Signed)
Subjective:  Patient ID: Bradley Robles, male    DOB: 06-Feb-1976,  MRN: [id number] HPI Chief Complaint  Patient presents with   Back Pain    Limb length difference - left leg shorter than right, having a lot of back pain, PCP referred   New Patient (Initial Visit)    46 y.o. male presents with the above complaint.   ROS: Denies fever chills nausea vomit muscle aches pains calf pain back pain chest pain shortness of breath.  Past Medical History:  Diagnosis Date   Hypertension    No past surgical history on file.  Current Outpatient Medications:    amLODipine (NORVASC) 10 MG tablet, Take 1 tablet (10 mg total) by mouth daily., Disp: 90 tablet, Rfl: 4   amLODipine (NORVASC) 5 MG tablet, Take 5 mg by mouth daily., Disp: , Rfl:    amLODipine (NORVASC) 5 MG tablet, TAKE 1 TABLET BY MOUTH ONCE DAILY., Disp: 90 tablet, Rfl: 3   atorvastatin (LIPITOR) 10 MG tablet, Take 1 tablet (10 mg total) by mouth daily., Disp: 90 tablet, Rfl: 3   cyclobenzaprine (FLEXERIL) 10 MG tablet, Take 1 tablet (10 mg total) by mouth at bedtime as needed., Disp: 30 tablet, Rfl: 6   diazepam (VALIUM) 5 MG tablet, Take 1 tablet (5 mg total) by mouth 2 (two) times daily., Disp: 10 tablet, Rfl: 0   HYDROcodone bit-homatropine (HYCODAN) 5-1.5 MG/5ML syrup, Take 5 mLs by mouth at bedtime as needed., Disp: 100 mL, Rfl: 0   ibuprofen (ADVIL,MOTRIN) 800 MG tablet, Take 1 tablet (800 mg total) by mouth 3 (three) times daily., Disp: 21 tablet, Rfl: 0   lisinopril (PRINIVIL) 10 MG tablet, Take 1 tablet (10 mg total) by mouth daily., Disp: 90 tablet, Rfl: 0   lisinopril (ZESTRIL) 40 MG tablet, TAKE 1 TABLET BY MOUTH ONCE DAILY., Disp: 90 tablet, Rfl: 3   lisinopril (ZESTRIL) 40 MG tablet, Take 1 tablet (40 mg total) by mouth daily., Disp: 90 tablet, Rfl: 4   methocarbamol (ROBAXIN) 500 MG tablet, Take 1 tablet (500 mg total) by mouth 2 (two) times daily., Disp: 20 tablet, Rfl: 0   Multiple Vitamins-Minerals (MEGA  MULTIVITAMIN FOR MEN PO), Take 1 tablet by mouth daily., Disp: , Rfl:    oxyCODONE-acetaminophen (PERCOCET/ROXICET) 5-325 MG tablet, Take 1 tablet by mouth every 4 (four) hours as needed for severe pain., Disp: 15 tablet, Rfl: 0   traMADol (ULTRAM) 50 MG tablet, Take 1 tablet (50 mg total) by mouth every 6 (six) hours as needed for pain. (Patient not taking: Reported on 06/24/2016), Disp: 15 tablet, Rfl: 0  No Known Allergies Review of Systems Objective:  There were no vitals filed for this visit.  General: Well developed, nourished, in no acute distress, alert and oriented x3   Dermatological: Skin is warm, dry and supple bilateral. Nails x 10 are well maintained; remaining integument appears unremarkable at this time. There are no open sores, no preulcerative lesions, no rash or signs of infection present.  Vascular: Dorsalis Pedis artery and Posterior Tibial artery pedal pulses are 2/4 bilateral with immedate capillary fill time. Pedal hair growth present. No varicosities and no lower extremity edema present bilateral.   Neruologic: Grossly intact via light touch bilateral. Vibratory intact via tuning fork bilateral. Protective threshold with Semmes Wienstein monofilament intact to all pedal sites bilateral. Patellar and Achilles deep tendon reflexes 2+ bilateral. No Babinski or clonus noted bilateral.   Musculoskeletal: No gross boney pedal deformities bilateral. No pain, crepitus, or limitation  noted with foot and ankle range of motion bilateral. Muscular strength 5/5 in all groups tested bilateral.  Left leg is about a quarter of an inch shorter than the. Gait: Unassisted, Nonantalgic.    Radiographs:  None taken  Assessment & Plan:   Assessment: Short left leg resulting in knee hip and back pain.  Plan: We will get him scheduled to have orthotics made and we will put 1/4 inch raise on his left heel.     Bradley Robles, [place]

## 2022-05-17 ENCOUNTER — Other Ambulatory Visit (HOSPITAL_COMMUNITY): Payer: Self-pay

## 2022-05-17 MED ORDER — CYCLOBENZAPRINE HCL 10 MG PO TABS
10.0000 mg | ORAL_TABLET | Freq: Every day | ORAL | 6 refills | Status: AC
  Filled 2022-05-17: qty 30, 30d supply, fill #0

## 2022-05-17 MED ORDER — LISINOPRIL 40 MG PO TABS
40.0000 mg | ORAL_TABLET | Freq: Every day | ORAL | 4 refills | Status: DC
  Filled 2022-05-17: qty 90, 90d supply, fill #0
  Filled 2023-05-16: qty 90, 90d supply, fill #1

## 2022-05-17 MED ORDER — AMLODIPINE BESYLATE 10 MG PO TABS
10.0000 mg | ORAL_TABLET | Freq: Every day | ORAL | 4 refills | Status: DC
  Filled 2022-05-17: qty 90, 90d supply, fill #0
  Filled 2023-05-16: qty 90, 90d supply, fill #1

## 2022-06-20 ENCOUNTER — Telehealth: Payer: Self-pay | Admitting: Podiatry

## 2022-06-20 NOTE — Telephone Encounter (Signed)
Lmom for pt to call back - orthotics are in   [facility] - charges need put in for orthotics

## 2022-07-18 NOTE — Progress Notes (Signed)
Patient presents today to pick up custom molded foot orthotics recommended by Dr. Milinda Pointer.   Orthotics were dispensed and fit was satisfactory. Reviewed instructions for break-in and wear. Written instructions given to patient.  Patient will follow up as needed.   [facility] - order # [id number]

## 2022-07-19 ENCOUNTER — Ambulatory Visit (INDEPENDENT_AMBULATORY_CARE_PROVIDER_SITE_OTHER): Payer: 59 | Admitting: *Deleted

## 2022-07-19 DIAGNOSIS — M2142 Flat foot [pes planus] (acquired), left foot: Secondary | ICD-10-CM

## 2022-07-19 DIAGNOSIS — M2170 Unequal limb length (acquired), unspecified site: Secondary | ICD-10-CM

## 2022-07-19 DIAGNOSIS — M2141 Flat foot [pes planus] (acquired), right foot: Secondary | ICD-10-CM

## 2022-11-26 ENCOUNTER — Other Ambulatory Visit (HOSPITAL_COMMUNITY): Payer: Self-pay

## 2022-11-26 MED ORDER — AMLODIPINE BESYLATE 10 MG PO TABS
10.0000 mg | ORAL_TABLET | Freq: Every day | ORAL | 1 refills | Status: AC
  Filled 2022-11-26: qty 90, 90d supply, fill #0
  Filled 2023-02-14: qty 90, 90d supply, fill #1

## 2022-11-26 MED ORDER — CYCLOBENZAPRINE HCL 10 MG PO TABS
10.0000 mg | ORAL_TABLET | Freq: Every evening | ORAL | 5 refills | Status: DC | PRN
  Filled 2022-11-26: qty 30, 30d supply, fill #0
  Filled 2023-09-16: qty 30, 30d supply, fill #1
  Filled 2023-10-30: qty 30, 30d supply, fill #2

## 2022-11-26 MED ORDER — LISINOPRIL 40 MG PO TABS
40.0000 mg | ORAL_TABLET | Freq: Every day | ORAL | 1 refills | Status: AC
  Filled 2022-11-26: qty 90, 90d supply, fill #0
  Filled 2023-02-14: qty 90, 90d supply, fill #1

## 2022-12-20 DIAGNOSIS — I10 Essential (primary) hypertension: Secondary | ICD-10-CM | POA: Diagnosis not present

## 2022-12-20 DIAGNOSIS — Z0000 Encounter for general adult medical examination without abnormal findings: Secondary | ICD-10-CM | POA: Diagnosis not present

## 2022-12-20 DIAGNOSIS — Z125 Encounter for screening for malignant neoplasm of prostate: Secondary | ICD-10-CM | POA: Diagnosis not present

## 2022-12-20 DIAGNOSIS — Z6832 Body mass index (BMI) 32.0-32.9, adult: Secondary | ICD-10-CM | POA: Diagnosis not present

## 2022-12-20 DIAGNOSIS — E782 Mixed hyperlipidemia: Secondary | ICD-10-CM | POA: Diagnosis not present

## 2023-02-14 ENCOUNTER — Other Ambulatory Visit (HOSPITAL_COMMUNITY): Payer: Self-pay

## 2023-05-16 ENCOUNTER — Other Ambulatory Visit (HOSPITAL_COMMUNITY): Payer: Self-pay

## 2023-09-16 ENCOUNTER — Other Ambulatory Visit (HOSPITAL_COMMUNITY): Payer: Self-pay

## 2023-09-17 ENCOUNTER — Other Ambulatory Visit (HOSPITAL_COMMUNITY): Payer: Self-pay

## 2023-09-17 MED ORDER — AMLODIPINE BESYLATE 10 MG PO TABS
10.0000 mg | ORAL_TABLET | Freq: Every day | ORAL | 1 refills | Status: AC
  Filled 2023-09-17: qty 30, 30d supply, fill #0
  Filled 2024-06-09: qty 30, 30d supply, fill #1
  Filled 2024-07-09 – 2024-07-16 (×2): qty 30, 30d supply, fill #2

## 2023-09-17 MED ORDER — LISINOPRIL 40 MG PO TABS
40.0000 mg | ORAL_TABLET | Freq: Every day | ORAL | 1 refills | Status: AC
  Filled 2023-09-17: qty 90, 90d supply, fill #0
  Filled 2023-10-15: qty 30, 30d supply, fill #0
  Filled 2023-11-20: qty 30, 30d supply, fill #1
  Filled 2023-12-18: qty 30, 30d supply, fill #2
  Filled 2024-04-28 – 2024-05-06 (×2): qty 30, 30d supply, fill #3

## 2023-09-17 MED ORDER — AMLODIPINE BESYLATE 10 MG PO TABS
10.0000 mg | ORAL_TABLET | Freq: Every day | ORAL | 1 refills | Status: AC
  Filled 2023-09-17 – 2023-10-15 (×2): qty 30, 30d supply, fill #0
  Filled 2023-11-20: qty 30, 30d supply, fill #1
  Filled 2023-12-18: qty 30, 30d supply, fill #2
  Filled 2024-03-10 – 2024-03-11 (×2): qty 30, 30d supply, fill #3
  Filled 2024-04-12: qty 30, 30d supply, fill #4
  Filled 2024-04-28 – 2024-05-06 (×2): qty 30, 30d supply, fill #5

## 2023-09-17 MED ORDER — LISINOPRIL 40 MG PO TABS
40.0000 mg | ORAL_TABLET | Freq: Every day | ORAL | 1 refills | Status: AC
  Filled 2023-09-17: qty 30, 30d supply, fill #0
  Filled 2024-06-09: qty 30, 30d supply, fill #1
  Filled 2024-07-09 – 2024-07-16 (×2): qty 30, 30d supply, fill #2

## 2023-09-18 ENCOUNTER — Other Ambulatory Visit (HOSPITAL_COMMUNITY): Payer: Self-pay

## 2023-10-15 ENCOUNTER — Other Ambulatory Visit (HOSPITAL_COMMUNITY): Payer: Self-pay

## 2024-01-23 ENCOUNTER — Other Ambulatory Visit (HOSPITAL_COMMUNITY): Payer: Self-pay

## 2024-01-23 MED ORDER — LISINOPRIL 40 MG PO TABS
40.0000 mg | ORAL_TABLET | Freq: Every day | ORAL | 5 refills | Status: AC
  Filled 2024-01-23: qty 30, 30d supply, fill #0
  Filled 2024-02-16 (×2): qty 30, 30d supply, fill #1
  Filled 2024-03-10 – 2024-03-11 (×2): qty 30, 30d supply, fill #2
  Filled 2024-04-12: qty 30, 30d supply, fill #3
  Filled 2024-07-15: qty 30, 30d supply, fill #4

## 2024-01-23 MED ORDER — AMLODIPINE BESYLATE 10 MG PO TABS
10.0000 mg | ORAL_TABLET | Freq: Every day | ORAL | 5 refills | Status: AC
  Filled 2024-01-23: qty 30, 30d supply, fill #0
  Filled 2024-02-16 (×2): qty 30, 30d supply, fill #1
  Filled 2024-06-09 – 2024-07-16 (×3): qty 30, 30d supply, fill #2

## 2024-01-23 MED ORDER — ATORVASTATIN CALCIUM 10 MG PO TABS
10.0000 mg | ORAL_TABLET | Freq: Every day | ORAL | 5 refills | Status: AC
  Filled 2024-01-23: qty 30, 30d supply, fill #0
  Filled 2024-06-09: qty 30, 30d supply, fill #1
  Filled 2024-07-15: qty 30, 30d supply, fill #2

## 2024-02-16 ENCOUNTER — Other Ambulatory Visit (HOSPITAL_COMMUNITY): Payer: Self-pay

## 2024-02-16 ENCOUNTER — Other Ambulatory Visit (HOSPITAL_BASED_OUTPATIENT_CLINIC_OR_DEPARTMENT_OTHER): Payer: Self-pay

## 2024-02-27 ENCOUNTER — Other Ambulatory Visit (HOSPITAL_COMMUNITY): Payer: Self-pay

## 2024-03-02 ENCOUNTER — Other Ambulatory Visit (HOSPITAL_COMMUNITY): Payer: Self-pay

## 2024-03-02 MED ORDER — CYCLOBENZAPRINE HCL 10 MG PO TABS
10.0000 mg | ORAL_TABLET | Freq: Every evening | ORAL | 5 refills | Status: AC | PRN
  Filled 2024-03-02: qty 30, 30d supply, fill #0
  Filled 2024-04-28: qty 30, 30d supply, fill #1
  Filled 2024-06-09: qty 30, 30d supply, fill #2

## 2024-03-03 ENCOUNTER — Other Ambulatory Visit (HOSPITAL_COMMUNITY): Payer: Self-pay

## 2024-03-10 ENCOUNTER — Other Ambulatory Visit (HOSPITAL_COMMUNITY): Payer: Self-pay

## 2024-03-10 ENCOUNTER — Encounter (HOSPITAL_COMMUNITY): Payer: Self-pay | Admitting: Pharmacist

## 2024-04-12 ENCOUNTER — Other Ambulatory Visit (HOSPITAL_COMMUNITY): Payer: Self-pay

## 2024-04-28 ENCOUNTER — Other Ambulatory Visit (HOSPITAL_COMMUNITY): Payer: Self-pay

## 2024-04-28 ENCOUNTER — Other Ambulatory Visit: Payer: Self-pay

## 2024-06-09 ENCOUNTER — Other Ambulatory Visit: Payer: Self-pay

## 2024-07-09 ENCOUNTER — Other Ambulatory Visit (HOSPITAL_COMMUNITY): Payer: Self-pay

## 2024-07-09 ENCOUNTER — Other Ambulatory Visit: Payer: Self-pay

## 2024-07-13 ENCOUNTER — Other Ambulatory Visit: Payer: Self-pay

## 2024-07-16 ENCOUNTER — Other Ambulatory Visit: Payer: Self-pay

## 2024-07-16 ENCOUNTER — Other Ambulatory Visit (HOSPITAL_COMMUNITY): Payer: Self-pay

## 2024-07-28 ENCOUNTER — Other Ambulatory Visit (HOSPITAL_COMMUNITY): Payer: Self-pay

## 2024-07-28 MED ORDER — AMOXICILLIN-POT CLAVULANATE 875-125 MG PO TABS
1.0000 | ORAL_TABLET | Freq: Two times a day (BID) | ORAL | 0 refills | Status: AC
  Filled 2024-07-28: qty 20, 10d supply, fill #0
